# Patient Record
Sex: Female | Born: 2005 | Race: White | Hispanic: No | Smoking: Former smoker
Health system: Southern US, Community
[De-identification: ages and names within clinical notes are randomized; demographics above are authoritative.]

## PROBLEM LIST (undated history)

## (undated) DIAGNOSIS — F419 Anxiety disorder, unspecified: Secondary | ICD-10-CM

## (undated) DIAGNOSIS — N83209 Unspecified ovarian cyst, unspecified side: Secondary | ICD-10-CM

## (undated) DIAGNOSIS — Z789 Other specified health status: Secondary | ICD-10-CM

## (undated) DIAGNOSIS — D649 Anemia, unspecified: Secondary | ICD-10-CM

## (undated) HISTORY — DX: Anxiety disorder, unspecified: F41.9

## (undated) HISTORY — DX: Other specified health status: Z78.9

## (undated) HISTORY — DX: Unspecified ovarian cyst, unspecified side: N83.209

## (undated) HISTORY — DX: Anemia, unspecified: D64.9

## (undated) HISTORY — PX: TONSILLECTOMY: SUR1361

---
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
## 2023-01-16 ENCOUNTER — Ambulatory Visit: Payer: Medicaid Other | Admitting: Licensed Practical Nurse

## 2023-02-10 ENCOUNTER — Ambulatory Visit: Payer: Medicaid Other

## 2023-02-13 ENCOUNTER — Ambulatory Visit: Payer: Medicaid Other | Admitting: Certified Nurse Midwife

## 2023-03-26 NOTE — Progress Notes (Signed)
PCP:  [facility]   Chief Complaint  Patient presents with   Gynecologic Exam    Discuss changing BC method to IUD due to severe pain during cycles     HPI:      [name] is a 17 y.o. G0P0000 whose LMP was Patient's last menstrual period was 03/16/2023 (exact date)., presents today for her annual examination.  Her menses are regular every 28-30 days, lasting 3-5 days, mod flow with 1 heavy day. Was doing continuous dosing but still getting periods, so now does placebo pills monthly. Dysmen mod to severe, worse past 3-4 months. Ibup 800 mg and tylenol don't help; sometimes takes midol with improvement. Doesn't miss work/activities due to pain. Dysmen was more "mild" last time I saw her. Hx of LTO cyst 4/23.  Had been on depo and then seasonale with irregular bleeding so doesn't want to try those. Would like to try IUD for dysmen.   Hx of 4.1 cm LTO hemorrhagic cyst 4/23; no f/u imaging needed  Sex activity: single partner, contraception - OCP (estrogen/progesterone). No pain/bleeding. Last Pap: N/A due to age Hx of STDs: none  There is no FH of breast cancer. There is no FH of ovarian cancer. The patient does not do self-breast exams.  Tobacco use: quit vaping 3-4 wks ago Alcohol use: none No drug use.  Exercise: moderately active  She does get adequate calcium but not Vitamin D in her diet.  Past Medical History:  Diagnosis Date   Anemia    Ovarian cyst     Past Surgical History:  Procedure Laterality Date   TONSILLECTOMY      Family History  Problem Relation Age of Onset   Hypertension Mother    Asthma Mother    Diabetes Maternal Grandmother    Diabetes Paternal Grandmother     Social History   Socioeconomic History   Marital status: Single    Spouse name: Not on file   Number of children: Not on file   Years of education: Not on file   Highest education level: Not on file  Occupational History   Not on file  Tobacco Use    Smoking status: Never   Smokeless tobacco: Never  Vaping Use   Vaping Use: Former  Substance and Sexual Activity   Alcohol use: No   Drug use: Yes    Types: Marijuana   Sexual activity: Yes    Birth control/protection: Pill  Other Topics Concern   Not on file  Social History Narrative   Not on file   Social Determinants of Health   Financial Resource Strain: Not on file  Food Insecurity: Not on file  Transportation Needs: Not on file  Physical Activity: Not on file  Stress: Not on file  Social Connections: Not on file  Intimate Partner Violence: Not At Risk (07/12/2020)   Humiliation, Afraid, Rape, and Kick questionnaire    Fear of Current or Ex-Partner: No    Emotionally Abused: No    Physically Abused: No    Sexually Abused: No     Current Outpatient Medications:    drospirenone-ethinyl estradiol (NIKKI) 3-0.02 MG tablet, Take 1 tablet by mouth daily. CONTINUOUS DOSING, Disp: 84 tablet, Rfl: 3   misoprostol (CYTOTEC) 100 MCG tablet, Take 1 tablet (100 mcg total) by mouth once for 1 dose. 1 hour before appt, Disp: 1 tablet, Rfl: 0     ROS:  Review of Systems  Constitutional:  Negative for fatigue,  fever and unexpected weight change.  Respiratory:  Negative for cough, shortness of breath and wheezing.   Cardiovascular:  Negative for chest pain, palpitations and leg swelling.  Gastrointestinal:  Negative for blood in stool, constipation, diarrhea, nausea and vomiting.  Endocrine: Negative for cold intolerance, heat intolerance and polyuria.  Genitourinary:  Negative for dyspareunia, dysuria, flank pain, frequency, genital sores, hematuria, menstrual problem, pelvic pain, urgency, vaginal bleeding, vaginal discharge and vaginal pain.  Musculoskeletal:  Negative for back pain, joint swelling and myalgias.  Skin:  Negative for rash.  Neurological:  Negative for dizziness, syncope, light-headedness, numbness and headaches.  Hematological:  Negative for adenopathy.   Psychiatric/Behavioral:  Negative for agitation, confusion, sleep disturbance and suicidal ideas. The patient is not nervous/anxious.    BREAST: No symptoms   Objective: BP 114/70   Ht 5\' 2"  (1.575 m)   Wt 143 lb (64.9 kg)   LMP 03/16/2023 (Exact Date)   BMI 26.16 kg/m    Physical Exam Constitutional:      Appearance: She is well-developed.  Genitourinary:     Vulva normal.     Right Labia: No rash, tenderness or lesions.    Left Labia: No tenderness, lesions or rash.    No vaginal discharge, erythema or tenderness.      Right Adnexa: not tender and no mass present.    Left Adnexa: not tender and no mass present.    No cervical friability or polyp.     Uterus is not enlarged or tender.  Breasts:    Right: No mass, nipple discharge, skin change or tenderness.     Left: No mass, nipple discharge, skin change or tenderness.  Neck:     Thyroid: No thyromegaly.  Cardiovascular:     Rate and Rhythm: Normal rate and regular rhythm.     Heart sounds: Normal heart sounds. No murmur heard. Pulmonary:     Effort: Pulmonary effort is normal.     Breath sounds: Normal breath sounds.  Abdominal:     Palpations: Abdomen is soft.     Tenderness: There is no abdominal tenderness. There is no guarding or rebound.  Musculoskeletal:        General: Normal range of motion.     Cervical back: Normal range of motion.  Lymphadenopathy:     Cervical: No cervical adenopathy.  Neurological:     General: No focal deficit present.     Mental Status: She is alert and oriented to person, place, and time.     Cranial Nerves: No cranial nerve deficit.  Skin:    General: Skin is warm and dry.  Psychiatric:        Mood and Affect: Mood normal.        Behavior: Behavior normal.        Thought Content: Thought content normal.        Judgment: Judgment normal.  Vitals reviewed.     Assessment/Plan:  Encounter for annual routine gynecological examination  Screening for STD (sexually  transmitted disease) - Plan: Chlamydia/Gonococcus/Trichomonas, NAA  Encounter for initial prescription of intrauterine contraceptive device (IUD) - Plan: misoprostol (CYTOTEC) 100 MCG tablet; Kyleena pros/cons discussed. Pt will RTO with menses for insertion; Rx cytotec/NSAIDs.   Dysmenorrhea--not relieved with OCPs now, rule out STDs. Pt wants to try IUD. Will check another GYN u/s if sx persist with IUD.    Meds ordered this encounter  Medications   misoprostol (CYTOTEC) 100 MCG tablet    Sig: Take 1 tablet (100 mcg  total) by mouth once for 1 dose. 1 hour before appt    Dispense:  1 tablet    Refill:  0    Order Specific Question:   Supervising Provider    Answer:   Waymon Budge             GYN counsel adequate intake of calcium and vitamin D, diet and exercise; d/c vaping     F/U  Return in about 1 year (around 03/26/2024).  Dabney Dever B. Beryle Zeitz, PA-C 03/27/2023 10:18 AM

## 2023-03-27 ENCOUNTER — Ambulatory Visit: Payer: MEDICAID | Admitting: Obstetrics and Gynecology

## 2023-03-27 ENCOUNTER — Encounter: Payer: Self-pay | Admitting: Obstetrics and Gynecology

## 2023-03-27 VITALS — BP 114/70 | Ht 62.0 in | Wt 143.0 lb

## 2023-03-27 DIAGNOSIS — Z3041 Encounter for surveillance of contraceptive pills: Secondary | ICD-10-CM

## 2023-03-27 DIAGNOSIS — Z01419 Encounter for gynecological examination (general) (routine) without abnormal findings: Secondary | ICD-10-CM

## 2023-03-27 DIAGNOSIS — N946 Dysmenorrhea, unspecified: Secondary | ICD-10-CM | POA: Insufficient documentation

## 2023-03-27 DIAGNOSIS — Z30014 Encounter for initial prescription of intrauterine contraceptive device: Secondary | ICD-10-CM

## 2023-03-27 DIAGNOSIS — N83202 Unspecified ovarian cyst, left side: Secondary | ICD-10-CM

## 2023-03-27 DIAGNOSIS — Z113 Encounter for screening for infections with a predominantly sexual mode of transmission: Secondary | ICD-10-CM

## 2023-03-27 MED ORDER — MISOPROSTOL 100 MCG PO TABS
100.0000 ug | ORAL_TABLET | Freq: Once | ORAL | 0 refills | Status: DC
Start: 2023-03-27 — End: 2023-04-14

## 2023-03-27 NOTE — Patient Instructions (Signed)
I value your feedback and you entrusting us with your care. If you get a [facility] patient survey, I would appreciate you taking the time to let us know about your experience today. Thank you! ? ? ?

## 2023-03-30 LAB — CHLAMYDIA/GONOCOCCUS/TRICHOMONAS, NAA
Chlamydia by NAA: NEGATIVE
Gonococcus by NAA: NEGATIVE
Trich vag by NAA: NEGATIVE

## 2023-04-12 ENCOUNTER — Other Ambulatory Visit: Payer: Self-pay

## 2023-04-12 ENCOUNTER — Emergency Department
Admission: EM | Admit: 2023-04-12 | Discharge: 2023-04-12 | Disposition: A | Discharge status: Home / Self Care | Payer: MEDICAID | Attending: Emergency Medicine | Admitting: Emergency Medicine

## 2023-04-12 ENCOUNTER — Emergency Department: Payer: MEDICAID

## 2023-04-12 ENCOUNTER — Encounter: Payer: Self-pay | Admitting: Emergency Medicine

## 2023-04-12 DIAGNOSIS — Y92410 Unspecified street and highway as the place of occurrence of the external cause: Secondary | ICD-10-CM | POA: Diagnosis not present

## 2023-04-12 DIAGNOSIS — R42 Dizziness and giddiness: Secondary | ICD-10-CM | POA: Insufficient documentation

## 2023-04-12 DIAGNOSIS — S0990XA Unspecified injury of head, initial encounter: Secondary | ICD-10-CM | POA: Diagnosis present

## 2023-04-12 DIAGNOSIS — S060X0A Concussion without loss of consciousness, initial encounter: Secondary | ICD-10-CM | POA: Insufficient documentation

## 2023-04-12 LAB — CBC
HCT: 39.4 % (ref 36.0–46.0)
Hemoglobin: 13.3 g/dL (ref 12.0–15.0)
MCH: 29.7 pg (ref 26.0–34.0)
MCHC: 33.8 g/dL (ref 30.0–36.0)
MCV: 87.9 fL (ref 80.0–100.0)
Platelets: 155 10*3/uL (ref 150–400)
RBC: 4.48 MIL/uL (ref 3.87–5.11)
RDW: 13.5 % (ref 11.5–15.5)
WBC: 8.3 10*3/uL (ref 4.0–10.5)
nRBC: 0 % (ref 0.0–0.2)

## 2023-04-12 LAB — URINALYSIS, ROUTINE W REFLEX MICROSCOPIC
Bacteria, UA: NONE SEEN
Bilirubin Urine: NEGATIVE
Glucose, UA: NEGATIVE mg/dL
Ketones, ur: 20 mg/dL — AB
Leukocytes,Ua: NEGATIVE
Nitrite: NEGATIVE
Protein, ur: NEGATIVE mg/dL
Specific Gravity, Urine: 1.02 (ref 1.005–1.030)
pH: 5 (ref 5.0–8.0)

## 2023-04-12 LAB — BASIC METABOLIC PANEL
Anion gap: 9 (ref 5–15)
BUN: 9 mg/dL (ref 6–20)
CO2: 23 mmol/L (ref 22–32)
Calcium: 9.2 mg/dL (ref 8.9–10.3)
Chloride: 108 mmol/L (ref 98–111)
Creatinine, Ser: 0.76 mg/dL (ref 0.44–1.00)
GFR, Estimated: >60 mL/min (ref >60)
Glucose, Bld: 90 mg/dL (ref 70–99)
Potassium: 3.4 mmol/L — ABNORMAL LOW (ref 3.5–5.1)
Sodium: 140 mmol/L (ref 135–145)

## 2023-04-12 LAB — POC URINE PREG, ED: Preg Test, Ur: NEGATIVE

## 2023-04-12 LAB — CBG MONITORING, ED: Glucose-Capillary: 91 mg/dL (ref 70–99)
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
## 2023-04-12 NOTE — Discharge Instructions (Addendum)
Please follow up with primary care within the week.  Return to the ER if symptoms worsen.
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
## 2023-04-14 ENCOUNTER — Encounter: Payer: Self-pay | Admitting: Obstetrics and Gynecology

## 2023-04-14 ENCOUNTER — Ambulatory Visit: Payer: MEDICAID | Admitting: Obstetrics and Gynecology

## 2023-04-14 VITALS — BP 110/60 | Ht 62.0 in | Wt 139.0 lb

## 2023-04-14 DIAGNOSIS — Z30430 Encounter for insertion of intrauterine contraceptive device: Secondary | ICD-10-CM

## 2023-04-14 MED ORDER — LEVONORGESTREL 19.5 MG IU IUD
INTRAUTERINE_SYSTEM | Freq: Once | INTRAUTERINE | Status: AC
Start: 2023-04-14 — End: 2023-04-14

## 2023-04-14 NOTE — Progress Notes (Signed)
   Chief Complaint  Patient presents with   Contraception    Kyleena insertion     IUD PROCEDURE NOTE:  Courtney [name] is a 17 y.o. G0P0000 here for  [facility]   IUD insertion for BC/dysmen. Had been on depo and then seasonale with irregular bleeding so doesn't want to try those. Would like to try IUD for dysmen.    BP 110/60   Ht 5\' 2"  (1.575 m)   Wt 139 lb (63 kg)   LMP 04/12/2023 (Exact Date)   BMI 25.42 kg/m   IUD Insertion Procedure Note Patient identified, informed consent performed, consent signed.   Discussed risks of irregular bleeding, cramping, infection, malpositioning or misplacement of the IUD outside the uterus which may require further procedure such as laparoscopy, risk of failure <1%. Time out was performed.    Speculum placed in the vagina.  Cervix visualized.  Cleaned with Betadine x 2.  Grasped anteriorly with a single tooth tenaculum.  Uterus sounded to 6.5 cm.   IUD placed per manufacturer's recommendations.  Strings trimmed to 3 cm. Tenaculum was removed, good hemostasis noted.  Patient tolerated procedure well.   ASSESSMENT:  Encounter for insertion of intrauterine contraceptive device (IUD) - Plan: levonorgestrel (KYLEENA) 19.5 MG IUD   Meds ordered this encounter  Medications   levonorgestrel (KYLEENA) 19.5 MG IUD     Plan:  Patient was given post-procedure instructions.  She was advised to have backup contraception for one week.   Call if you are having increasing pain, cramps or bleeding or if you have a fever greater than 100.4 degrees F., shaking chills, nausea or vomiting. Patient was also asked to check IUD strings periodically and follow up in 4 weeks for IUD check.  Return in about 6 weeks (around 05/26/2023) for IUD f/u.  Pradyun Ishman B. Kennidi Yoshida, PA-C 04/14/2023 4:29 PM

## 2023-04-14 NOTE — Patient Instructions (Addendum)
I value your feedback and you entrusting us with your care. If you get a [facility] patient survey, I would appreciate you taking the time to let us know about your experience today. Thank you!  Instructions after IUD insertion  Most women experience no significant problems after insertion of an IUD, however minor cramping and spotting for a few days is common. Cramps may be treated with ibuprofen 800mg every 8 hours or Tylenol 650 mg every 4 hours. Contact [facility] immediately if you experience any of the following symptoms during the next week: temperature >99.6 degrees, worsening pelvic pain, abdominal pain, fainting, unusually heavy vaginal bleeding, foul vaginal discharge, or if you think you have expelled the IUD. Nothing inserted in the vagina for 48 hours. You will be scheduled for a follow up visit in approximately four weeks.    

## 2023-05-05 ENCOUNTER — Encounter: Payer: Self-pay | Admitting: Obstetrics and Gynecology

## 2023-06-01 NOTE — Progress Notes (Deleted)
   No chief complaint on file.    History of Present Illness:  Courtney [name] is a 17 y.o. that had a  [place]  IUD placed approximately 6 weeks ago. Since that time, she denies dyspareunia, pelvic pain, non-menstrual bleeding, vaginal d/c, heavy bleeding.   Review of Systems  Physical Exam:  There were no vitals taken for this visit. There is no height or weight on file to calculate BMI.  Pelvic exam:  Two IUD strings {DESC; PRESENT/ABSENT:17923} seen coming from the cervical os. EGBUS, vaginal vault and cervix: within normal limits   Assessment:   No diagnosis found.  IUD strings present in proper location; pt doing well  Plan: F/u if any signs of infection or can no longer feel the strings.   Mylei Brackeen B. Sadao Weyer, PA-C 06/01/2023 1:00 PM

## 2023-06-02 ENCOUNTER — Telehealth: Payer: Self-pay | Admitting: Obstetrics and Gynecology

## 2023-06-02 ENCOUNTER — Ambulatory Visit: Payer: MEDICAID | Admitting: Obstetrics and Gynecology

## 2023-06-02 DIAGNOSIS — Z30431 Encounter for routine checking of intrauterine contraceptive device: Secondary | ICD-10-CM

## 2023-06-02 NOTE — Telephone Encounter (Signed)
I contacted the patient via phone. The patient was scheduled for 9/10 with Alicia Copland at 9:15 am. The patient missed the appointment. I left message for the patient to call back to reschduled.

## 2023-07-12 NOTE — Progress Notes (Unsigned)
   No chief complaint on file.    History of Present Illness:  Courtney [name] is a 17 y.o. that had a  Kyleena  IUD placed 04/14/23. Since that time, she denies dyspareunia, pelvic pain, non-menstrual bleeding, vaginal d/c, heavy bleeding.   Review of Systems  Physical Exam:  There were no vitals taken for this visit. There is no height or weight on file to calculate BMI.  Pelvic exam:  Two IUD strings {DESC; PRESENT/ABSENT:17923} seen coming from the cervical os. EGBUS, vaginal vault and cervix: within normal limits   Assessment:   No diagnosis found.  IUD strings present in proper location; pt doing well  Plan: F/u if any signs of infection or can no longer feel the strings.   Courtney [name] B. Riya Huxford, PA-C 07/12/2023 5:05 PM

## 2023-07-13 ENCOUNTER — Ambulatory Visit: Payer: MEDICAID | Admitting: Obstetrics and Gynecology

## 2023-07-13 ENCOUNTER — Encounter: Payer: Self-pay | Admitting: Obstetrics and Gynecology

## 2023-07-13 VITALS — BP 102/60 | Ht 62.0 in | Wt 140.0 lb

## 2023-07-13 DIAGNOSIS — N921 Excessive and frequent menstruation with irregular cycle: Secondary | ICD-10-CM

## 2023-07-13 DIAGNOSIS — Z30431 Encounter for routine checking of intrauterine contraceptive device: Secondary | ICD-10-CM | POA: Diagnosis not present

## 2023-07-13 DIAGNOSIS — N946 Dysmenorrhea, unspecified: Secondary | ICD-10-CM

## 2023-07-13 NOTE — Patient Instructions (Signed)
I value your feedback and you entrusting us with your care. If you get a [facility] patient survey, I would appreciate you taking the time to let us know about your experience today. Thank you! ? ? ?

## 2023-08-12 ENCOUNTER — Ambulatory Visit: Payer: MEDICAID

## 2023-08-12 DIAGNOSIS — Z30431 Encounter for routine checking of intrauterine contraceptive device: Secondary | ICD-10-CM

## 2023-08-12 DIAGNOSIS — N946 Dysmenorrhea, unspecified: Secondary | ICD-10-CM

## 2023-08-12 DIAGNOSIS — N921 Excessive and frequent menstruation with irregular cycle: Secondary | ICD-10-CM

## 2023-08-13 ENCOUNTER — Telehealth: Payer: Self-pay | Admitting: Obstetrics and Gynecology

## 2023-08-13 DIAGNOSIS — N921 Excessive and frequent menstruation with irregular cycle: Secondary | ICD-10-CM

## 2023-08-13 MED ORDER — NORETHINDRONE 0.35 MG PO TABS
1.0000 | ORAL_TABLET | Freq: Every day | ORAL | 0 refills | Status: DC
Start: 2023-08-13 — End: 2023-12-29

## 2023-08-13 NOTE — Telephone Encounter (Signed)
LM with neg GYN u/s results. IUD in correct location. Will try Rx POPs for BTB/dysmen. Pt to f/u prn.   Meds ordered this encounter  Medications   norethindrone (MICRONOR) 0.35 MG tablet    Sig: Take 1 tablet (0.35 mg total) by mouth daily.    Dispense:  84 tablet    Refill:  0    Order Specific Question:   Supervising Provider    Answer:   Hildred Laser [[id number]]

## 2023-09-15 ENCOUNTER — Ambulatory Visit
Admission: EM | Admit: 2023-09-15 | Discharge: 2023-09-15 | Disposition: A | Discharge status: Home / Self Care | Payer: MEDICAID | Attending: Emergency Medicine | Admitting: Emergency Medicine

## 2023-09-15 ENCOUNTER — Emergency Department: Payer: MEDICAID | Admitting: Certified Registered"

## 2023-09-15 ENCOUNTER — Other Ambulatory Visit: Payer: Self-pay

## 2023-09-15 ENCOUNTER — Encounter
Admission: EM | Disposition: A | Discharge status: Home / Self Care | Payer: Self-pay | Source: Home / Self Care | Attending: Emergency Medicine

## 2023-09-15 ENCOUNTER — Emergency Department: Payer: MEDICAID

## 2023-09-15 DIAGNOSIS — K352 Acute appendicitis with generalized peritonitis, without perforation or abscess: Secondary | ICD-10-CM

## 2023-09-15 DIAGNOSIS — K3580 Unspecified acute appendicitis: Secondary | ICD-10-CM | POA: Insufficient documentation

## 2023-09-15 DIAGNOSIS — Z87891 Personal history of nicotine dependence: Secondary | ICD-10-CM | POA: Insufficient documentation

## 2023-09-15 HISTORY — PX: LAPAROSCOPIC APPENDECTOMY: SHX408

## 2023-09-15 LAB — URINALYSIS, ROUTINE W REFLEX MICROSCOPIC
Bilirubin Urine: NEGATIVE
Glucose, UA: NEGATIVE mg/dL
Ketones, ur: 20 mg/dL — AB
Nitrite: NEGATIVE
Protein, ur: NEGATIVE mg/dL
Specific Gravity, Urine: 1.013 (ref 1.005–1.030)
pH: 6 (ref 5.0–8.0)

## 2023-09-15 LAB — CBC
HCT: 41.6 % (ref 36.0–46.0)
Hemoglobin: 14 g/dL (ref 12.0–15.0)
MCH: 30.8 pg (ref 26.0–34.0)
MCHC: 33.7 g/dL (ref 30.0–36.0)
MCV: 91.6 fL (ref 80.0–100.0)
Platelets: 123 10*3/uL — ABNORMAL LOW (ref 150–400)
RBC: 4.54 MIL/uL (ref 3.87–5.11)
RDW: 13.4 % (ref 11.5–15.5)
WBC: 12.5 10*3/uL — ABNORMAL HIGH (ref 4.0–10.5)
nRBC: 0 % (ref 0.0–0.2)

## 2023-09-15 LAB — COMPREHENSIVE METABOLIC PANEL
ALT: 17 U/L (ref 0–44)
AST: 18 U/L (ref 15–41)
Albumin: 4.5 g/dL (ref 3.5–5.0)
Alkaline Phosphatase: 57 U/L (ref 38–126)
Anion gap: 9 (ref 5–15)
BUN: 8 mg/dL (ref 6–20)
CO2: 23 mmol/L (ref 22–32)
Calcium: 9.1 mg/dL (ref 8.9–10.3)
Chloride: 101 mmol/L (ref 98–111)
Creatinine, Ser: 0.74 mg/dL (ref 0.44–1.00)
GFR, Estimated: >60 mL/min (ref >60)
Glucose, Bld: 109 mg/dL — ABNORMAL HIGH (ref 70–99)
Potassium: 4.2 mmol/L (ref 3.5–5.1)
Sodium: 133 mmol/L — ABNORMAL LOW (ref 135–145)
Total Bilirubin: 0.8 mg/dL (ref <1.2)
Total Protein: 7.6 g/dL (ref 6.5–8.1)

## 2023-09-15 LAB — POC URINE PREG, ED: Preg Test, Ur: NEGATIVE

## 2023-09-15 LAB — LIPASE, BLOOD: Lipase: 25 U/L (ref 11–51)

## 2023-09-15 SURGERY — APPENDECTOMY, LAPAROSCOPIC
Anesthesia: General

## 2023-09-15 MED ORDER — OXYCODONE HCL 5 MG PO TABS: 5.0000 mg | ORAL_TABLET | Freq: Four times a day (QID) | ORAL | 0 refills | Status: DC | PRN

## 2023-09-15 MED ORDER — ACETAMINOPHEN 10 MG/ML IV SOLN
INTRAVENOUS | Status: AC
  Filled 2023-09-15: qty 100

## 2023-09-15 MED ORDER — SODIUM CHLORIDE 0.9 % IV BOLUS
1000.0000 mL | Freq: Once | INTRAVENOUS | Status: AC
  Administered 2023-09-15: 1000 mL via INTRAVENOUS

## 2023-09-15 MED ORDER — LIDOCAINE HCL (CARDIAC) PF 100 MG/5ML IV SOSY
PREFILLED_SYRINGE | INTRAVENOUS | Status: DC | PRN
  Administered 2023-09-15: 100 mg via INTRAVENOUS

## 2023-09-15 MED ORDER — KETAMINE HCL 50 MG/5ML IJ SOSY
PREFILLED_SYRINGE | INTRAMUSCULAR | Status: AC
  Filled 2023-09-15: qty 5

## 2023-09-15 MED ORDER — ACETAMINOPHEN 10 MG/ML IV SOLN
INTRAVENOUS | Status: DC | PRN
  Administered 2023-09-15: 1000 mg via INTRAVENOUS

## 2023-09-15 MED ORDER — ACETAMINOPHEN 10 MG/ML IV SOLN: 1000.0000 mg | Freq: Once | INTRAVENOUS | Status: DC | PRN

## 2023-09-15 MED ORDER — PROPOFOL 10 MG/ML IV BOLUS
INTRAVENOUS | Status: DC | PRN
  Administered 2023-09-15: 150 mg via INTRAVENOUS

## 2023-09-15 MED ORDER — HYDROMORPHONE HCL 1 MG/ML IJ SOLN
INTRAMUSCULAR | Status: DC | PRN
  Administered 2023-09-15: .5 mg via INTRAVENOUS

## 2023-09-15 MED ORDER — HYDROMORPHONE HCL 1 MG/ML IJ SOLN
INTRAMUSCULAR | Status: AC
  Filled 2023-09-15: qty 1

## 2023-09-15 MED ORDER — SODIUM CHLORIDE 0.9 % IV SOLN
INTRAVENOUS | Status: AC
  Filled 2023-09-15: qty 2

## 2023-09-15 MED ORDER — MIDAZOLAM HCL 2 MG/2ML IJ SOLN
INTRAMUSCULAR | Status: AC
  Filled 2023-09-15: qty 2

## 2023-09-15 MED ORDER — 0.9 % SODIUM CHLORIDE (POUR BTL) OPTIME
TOPICAL | Status: DC | PRN
  Administered 2023-09-15: 500 mL

## 2023-09-15 MED ORDER — OXYCODONE HCL 5 MG/5ML PO SOLN: 5.0000 mg | Freq: Once | ORAL | Status: DC | PRN

## 2023-09-15 MED ORDER — DROPERIDOL 2.5 MG/ML IJ SOLN
INTRAMUSCULAR | Status: AC
  Filled 2023-09-15: qty 2

## 2023-09-15 MED ORDER — KETOROLAC TROMETHAMINE 30 MG/ML IJ SOLN
15.0000 mg | Freq: Once | INTRAMUSCULAR | Status: AC
  Administered 2023-09-15: 15 mg via INTRAVENOUS
  Filled 2023-09-15: qty 1

## 2023-09-15 MED ORDER — PROPOFOL 10 MG/ML IV BOLUS
INTRAVENOUS | Status: AC
  Filled 2023-09-15: qty 20

## 2023-09-15 MED ORDER — DEXMEDETOMIDINE HCL IN NACL 80 MCG/20ML IV SOLN
INTRAVENOUS | Status: DC | PRN
  Administered 2023-09-15: 12 ug via INTRAVENOUS
  Administered 2023-09-15: 8 ug via INTRAVENOUS

## 2023-09-15 MED ORDER — DEXAMETHASONE SODIUM PHOSPHATE 10 MG/ML IJ SOLN
INTRAMUSCULAR | Status: AC
  Filled 2023-09-15: qty 1

## 2023-09-15 MED ORDER — SUCCINYLCHOLINE CHLORIDE 200 MG/10ML IV SOSY
PREFILLED_SYRINGE | INTRAVENOUS | Status: DC | PRN
  Administered 2023-09-15: 80 mg via INTRAVENOUS

## 2023-09-15 MED ORDER — ONDANSETRON HCL 4 MG/2ML IJ SOLN
INTRAMUSCULAR | Status: DC | PRN
  Administered 2023-09-15: 4 mg via INTRAVENOUS

## 2023-09-15 MED ORDER — DEXAMETHASONE SODIUM PHOSPHATE 10 MG/ML IJ SOLN
INTRAMUSCULAR | Status: DC | PRN
  Administered 2023-09-15: 10 mg via INTRAVENOUS

## 2023-09-15 MED ORDER — ONDANSETRON HCL 4 MG/2ML IJ SOLN
INTRAMUSCULAR | Status: AC
  Filled 2023-09-15: qty 2

## 2023-09-15 MED ORDER — SUGAMMADEX SODIUM 200 MG/2ML IV SOLN
INTRAVENOUS | Status: DC | PRN
  Administered 2023-09-15: 200 mg via INTRAVENOUS

## 2023-09-15 MED ORDER — ROCURONIUM BROMIDE 100 MG/10ML IV SOLN
INTRAVENOUS | Status: DC | PRN
  Administered 2023-09-15: 50 mg via INTRAVENOUS

## 2023-09-15 MED ORDER — BUPIVACAINE LIPOSOME 1.3 % IJ SUSP
INTRAMUSCULAR | Status: DC | PRN
  Administered 2023-09-15: 10 mL

## 2023-09-15 MED ORDER — DROPERIDOL 2.5 MG/ML IJ SOLN
0.6250 mg | Freq: Once | INTRAMUSCULAR | Status: AC | PRN
  Administered 2023-09-15: 0.625 mg via INTRAVENOUS

## 2023-09-15 MED ORDER — LACTATED RINGERS IV SOLN: INTRAVENOUS | Status: DC | PRN

## 2023-09-15 MED ORDER — FENTANYL CITRATE (PF) 100 MCG/2ML IJ SOLN: 25.0000 ug | INTRAMUSCULAR | Status: DC | PRN

## 2023-09-15 MED ORDER — OXYCODONE HCL 5 MG PO TABS: 5.0000 mg | ORAL_TABLET | Freq: Once | ORAL | Status: DC | PRN

## 2023-09-15 MED ORDER — KETAMINE HCL 50 MG/5ML IJ SOSY
PREFILLED_SYRINGE | INTRAMUSCULAR | Status: DC | PRN
  Administered 2023-09-15: 30 mg via INTRAVENOUS

## 2023-09-15 MED ORDER — SODIUM CHLORIDE 0.9 % IV SOLN
1.0000 g | Freq: Two times a day (BID) | INTRAVENOUS | Status: DC
  Filled 2023-09-15 (×2): qty 1

## 2023-09-15 MED ORDER — PHENYLEPHRINE 80 MCG/ML (10ML) SYRINGE FOR IV PUSH (FOR BLOOD PRESSURE SUPPORT)
PREFILLED_SYRINGE | INTRAVENOUS | Status: DC | PRN
  Administered 2023-09-15: 120 ug via INTRAVENOUS
  Administered 2023-09-15: 80 ug via INTRAVENOUS

## 2023-09-15 MED ORDER — METOCLOPRAMIDE HCL 5 MG/ML IJ SOLN
10.0000 mg | Freq: Once | INTRAMUSCULAR | Status: AC
  Administered 2023-09-15: 10 mg via INTRAVENOUS
  Filled 2023-09-15: qty 2

## 2023-09-15 MED ORDER — ROCURONIUM BROMIDE 10 MG/ML (PF) SYRINGE
PREFILLED_SYRINGE | INTRAVENOUS | Status: AC
  Filled 2023-09-15: qty 10

## 2023-09-15 MED ORDER — BUPIVACAINE-EPINEPHRINE 0.5% -1:200000 IJ SOLN
INTRAMUSCULAR | Status: DC | PRN
  Administered 2023-09-15: 10 mL

## 2023-09-15 MED ORDER — SODIUM CHLORIDE 0.9 % IV SOLN
2.0000 g | Freq: Two times a day (BID) | INTRAVENOUS | Status: DC
  Administered 2023-09-15: 2 g via INTRAVENOUS
  Filled 2023-09-15: qty 2

## 2023-09-15 MED ORDER — IOHEXOL 300 MG/ML  SOLN
80.0000 mL | Freq: Once | INTRAMUSCULAR | Status: AC | PRN
  Administered 2023-09-15: 80 mL via INTRAVENOUS

## 2023-09-15 MED ORDER — PHENYLEPHRINE 80 MCG/ML (10ML) SYRINGE FOR IV PUSH (FOR BLOOD PRESSURE SUPPORT)
PREFILLED_SYRINGE | INTRAVENOUS | Status: AC
Start: 2023-09-15 — End: ?
  Filled 2023-09-15: qty 10

## 2023-09-15 MED ORDER — ONDANSETRON HCL 4 MG/2ML IJ SOLN
4.0000 mg | Freq: Once | INTRAMUSCULAR | Status: AC
  Administered 2023-09-15: 4 mg via INTRAVENOUS
  Filled 2023-09-15: qty 2

## 2023-09-15 MED ORDER — BUPIVACAINE-EPINEPHRINE (PF) 0.5% -1:200000 IJ SOLN
INTRAMUSCULAR | Status: AC
  Filled 2023-09-15: qty 10

## 2023-09-15 MED ORDER — MIDAZOLAM HCL 2 MG/2ML IJ SOLN
INTRAMUSCULAR | Status: DC | PRN
  Administered 2023-09-15: 2 mg via INTRAVENOUS

## 2023-09-15 MED ORDER — SUCCINYLCHOLINE CHLORIDE 200 MG/10ML IV SOSY
PREFILLED_SYRINGE | INTRAVENOUS | Status: AC
Start: 2023-09-15 — End: ?
  Filled 2023-09-15: qty 10

## 2023-09-15 SURGICAL SUPPLY — 49 items
APPLIER CLIP ROT 10 11.4 M/L (STAPLE)
BLADE CLIPPER SURG (BLADE) IMPLANT
CLIP APPLIE ROT 10 11.4 M/L (STAPLE) IMPLANT
CORD MONOPOLAR M/FML 12FT (MISCELLANEOUS) ×1 IMPLANT
CUTTER ECHEON FLEX ENDO 45 340 (ENDOMECHANICALS) IMPLANT
CUTTER FLEX LINEAR 45M (STAPLE) IMPLANT
DERMABOND ADVANCED .7 DNX12 (GAUZE/BANDAGES/DRESSINGS) ×1 IMPLANT
DRAIN CHANNEL JP 19F RND 3/16 (MISCELLANEOUS) IMPLANT
ELECT REM PT RETURN 9FT ADLT (ELECTROSURGICAL) ×1
ELECTRODE REM PT RTRN 9FT ADLT (ELECTROSURGICAL) ×1 IMPLANT
EVACUATOR SILICONE 100CC (DRAIN) IMPLANT
GLOVE BIOGEL PI IND STRL 7.5 (GLOVE) ×1 IMPLANT
GLOVE SURG SYN 7.0 (GLOVE) ×1 IMPLANT
GLOVE SURG SYN 7.0 PF PI (GLOVE) ×1 IMPLANT
GOWN STRL REUS W/ TWL LRG LVL3 (GOWN DISPOSABLE) ×2 IMPLANT
GRASPER SUT TROCAR 14GX15 (MISCELLANEOUS) ×1 IMPLANT
IRRIGATION STRYKERFLOW (MISCELLANEOUS) IMPLANT
IRRIGATOR STRYKERFLOW (MISCELLANEOUS)
IV NS 1000ML BAXH (IV SOLUTION) IMPLANT
KIT PINK PAD W/HEAD ARE REST (MISCELLANEOUS) ×1 IMPLANT
KIT PINK PAD W/HEAD ARM REST (MISCELLANEOUS) ×1 IMPLANT
KIT TURNOVER KIT A (KITS) ×1 IMPLANT
LIGASURE LAP MARYLAND 5MM 37CM (ELECTROSURGICAL) IMPLANT
MANIFOLD NEPTUNE II (INSTRUMENTS) ×1 IMPLANT
NDL HYPO 22X1.5 SAFETY MO (MISCELLANEOUS) ×1 IMPLANT
NDL INSUFFLATION 14GA 120MM (NEEDLE) ×1 IMPLANT
NEEDLE HYPO 22X1.5 SAFETY MO (MISCELLANEOUS) ×1 IMPLANT
NEEDLE INSUFFLATION 14GA 120MM (NEEDLE) ×1 IMPLANT
NS IRRIG 500ML POUR BTL (IV SOLUTION) ×1 IMPLANT
PACK LAP CHOLECYSTECTOMY (MISCELLANEOUS) ×1 IMPLANT
RELOAD 45 VASCULAR/THIN (ENDOMECHANICALS) IMPLANT
RELOAD STAPLE 45 2.5 WHT GRN (ENDOMECHANICALS) IMPLANT
RELOAD STAPLE 45 3.5 BLU ETS (ENDOMECHANICALS) IMPLANT
RELOAD STAPLE 45 3.6 BLU REG (STAPLE) IMPLANT
RELOAD STAPLE TA45 3.5 REG BLU (ENDOMECHANICALS) ×1 IMPLANT
SCISSORS METZENBAUM CVD 33 (INSTRUMENTS) IMPLANT
SET TUBE SMOKE EVAC HIGH FLOW (TUBING) ×1 IMPLANT
SLEEVE Z-THREAD 5X100MM (TROCAR) ×1 IMPLANT
STAPLE RELOAD 45MM BLUE (STAPLE) IMPLANT
SUT ETHILON 2 0 FS 18 (SUTURE) IMPLANT
SUT MNCRL AB 4-0 PS2 18 (SUTURE) ×1 IMPLANT
SUT VICRYL 0 AB UR-6 (SUTURE) ×1 IMPLANT
SYS BAG RETRIEVAL 10MM (BASKET) ×1
SYSTEM BAG RETRIEVAL 10MM (BASKET) ×1 IMPLANT
TRAP FLUID SMOKE EVACUATOR (MISCELLANEOUS) ×1 IMPLANT
TRAY FOLEY MTR SLVR 16FR STAT (SET/KITS/TRAYS/PACK) ×1 IMPLANT
TROCAR Z-THREAD FIOS 12X100MM (TROCAR) ×1 IMPLANT
TROCAR Z-THREAD FIOS 5X100MM (TROCAR) ×1 IMPLANT
WATER STERILE IRR 500ML POUR (IV SOLUTION) ×1 IMPLANT

## 2023-09-15 NOTE — Anesthesia Preprocedure Evaluation (Signed)
Anesthesia Evaluation  Patient identified by MRN, date of birth, ID band Patient awake    Reviewed: Allergy & Precautions, H&P , NPO status , Patient's Chart, lab work & pertinent test results  Airway Mallampati: II  TM Distance: >3 FB Neck ROM: full    Dental no notable dental hx.    Pulmonary Current Smoker and Patient abstained from smoking.   Pulmonary exam normal        Cardiovascular negative cardio ROS Normal cardiovascular exam     Neuro/Psych negative neurological ROS  negative psych ROS   GI/Hepatic Neg liver ROS,,,  Endo/Other  negative endocrine ROS    Renal/GU      Musculoskeletal   Abdominal  (+)  Abdomen: tender.   Peds  Hematology negative hematology ROS (+)   Anesthesia Other Findings ACUTE APPENDICITIS  Past Medical History: No date: Anemia No date: Ovarian cyst  Past Surgical History: No date: TONSILLECTOMY  BMI    Body Mass Index: 25.60 kg/m      Reproductive/Obstetrics negative OB ROS                              Anesthesia Physical Anesthesia Plan  ASA: 2  Anesthesia Plan: General ETT   Post-op Pain Management: Ofirmev IV (intra-op)* and Toradol IV (intra-op)*   Induction: Intravenous  PONV Risk Score and Plan: 3 and Ondansetron, Dexamethasone and Midazolam  Airway Management Planned: Oral ETT  Additional Equipment:   Intra-op Plan:   Post-operative Plan: Extubation in OR  Informed Consent: I have reviewed the patients History and Physical, chart, labs and discussed the procedure including the risks, benefits and alternatives for the proposed anesthesia with the patient or authorized representative who has indicated his/her understanding and acceptance.     Dental Advisory Given  Plan Discussed with: CRNA and Surgeon  Anesthesia Plan Comments:          Anesthesia Quick Evaluation

## 2023-09-15 NOTE — ED Provider Notes (Signed)
[facility] Emergency Department Provider Note     Event Date/Time   First MD Initiated Contact with Patient 09/15/23 1502     (approximate)   History   Abdominal Pain   HPI  Courtney [name] is a 17 y.o. female with a history of anemia presents to the ED with acute right lower quadrant abdominal pain with radiation to the left side.  Associated symptoms includes vomiting.  Denies fever and chills.     Physical Exam   Triage Vital Signs: ED Triage Vitals  Encounter Vitals Group     BP 09/15/23 1407 130/72     Systolic BP Percentile --      Diastolic BP Percentile --      Pulse Rate 09/15/23 1405 82     Resp 09/15/23 1405 17     Temp 09/15/23 1405 98 F (36.7 C)     Temp Source 09/15/23 1405 Oral     SpO2 09/15/23 1405 98 %     Weight 09/15/23 1406 139 lb 15.9 oz (63.5 kg)     Height 09/15/23 1406 5\' 2"  (1.575 m)     Head Circumference --      Peak Flow --      Pain Score 09/15/23 1406 8     Pain Loc --      Pain Education --      Exclude from Growth Chart --     Most recent vital signs: Vitals:   09/15/23 1407 09/15/23 1705  BP: 130/72 104/70  Pulse:  69  Resp:  15  Temp:    SpO2:  97%    General Awake, no distress.  Actively vomiting. HEENT NCAT. PERRL. EOMI. No rhinorrhea. Mucous membranes are moist.  CV:  Good peripheral perfusion.  RESP:  Normal effort.  ABD:  No distention.  Soft. tender out of proportion right lower quadrant    ED Results / Procedures / Treatments   Labs (all labs ordered are listed, but only abnormal results are displayed) Labs Reviewed  COMPREHENSIVE METABOLIC PANEL - Abnormal; Notable for the following components:      Result Value   Sodium 133 (*)    Glucose, Bld 109 (*)    All other components within normal limits  CBC - Abnormal; Notable for the following components:   WBC 12.5 (*)    Platelets 123 (*)    All other components within normal limits  URINALYSIS, ROUTINE W REFLEX  MICROSCOPIC - Abnormal; Notable for the following components:   Color, Urine YELLOW (*)    APPearance HAZY (*)    Hgb urine dipstick MODERATE (*)    Ketones, ur 20 (*)    Leukocytes,Ua TRACE (*)    Bacteria, UA FEW (*)    All other components within normal limits  LIPASE, BLOOD  POC URINE PREG, ED   RADIOLOGY  I personally viewed and evaluated these images as part of my medical decision making, as well as reviewing the written report by the radiologist.  ED Provider Interpretation: Noted enlargement of the appendix.  Will confirm with radiology read  CT ABDOMEN PELVIS W CONTRAST Result Date: 09/15/2023 CLINICAL DATA:  Right lower quadrant abdominal pain. EXAM: CT ABDOMEN AND PELVIS WITH CONTRAST TECHNIQUE: Multidetector CT imaging of the abdomen and pelvis was performed using the standard protocol following bolus administration of intravenous contrast. RADIATION DOSE REDUCTION: This exam was performed according to the departmental dose-optimization program which includes automated exposure control, adjustment of the mA  and/or kV according to patient size and/or use of iterative reconstruction technique. CONTRAST:  80mL OMNIPAQUE IOHEXOL 300 MG/ML  SOLN COMPARISON:  CT abdomen pelvis dated 01/28/2022. FINDINGS: Lower chest: The visualized lung bases are clear. No intra-abdominal free air or free fluid. Hepatobiliary: The liver is unremarkable. No biliary ductal dilatation. The gallbladder is unremarkable. Pancreas: Unremarkable. No pancreatic ductal dilatation or surrounding inflammatory changes. Spleen: Normal in size without focal abnormality. Adrenals/Urinary Tract: The adrenal glands are unremarkable. The kidneys, visualized ureters, and urinary bladder appear unremarkable. Stomach/Bowel: There is no bowel obstruction. The appendix is mildly enlarged and inflamed measuring up to 9 mm in diameter. There is a stone in the base of the appendix. The appendix is located in the right hemipelvis  medial to the cecum and ascending colon and extends upward along the right lateral pelvic sidewall. No drainable fluid collection/abscess. No perforation. Vascular/Lymphatic: The abdominal aorta and IVC are unremarkable. No portal venous gas. There is no adenopathy. Reproductive: The uterus is anteverted. An intrauterine device is noted. No suspicious adnexal masses. Other: None Musculoskeletal: No acute or significant osseous findings. IMPRESSION: Acute appendicitis.  No abscess or perforation. Electronically Signed   By: Elgie Collard M.D.   On: 09/15/2023 16:01    PROCEDURES:  Critical Care performed: No  Procedures   MEDICATIONS ORDERED IN ED: Medications  cefoTEtan (CEFOTAN) 1 g in sodium chloride 0.9 % 100 mL IVPB (has no administration in time range)  sodium chloride 0.9 % bolus 1,000 mL (0 mLs Intravenous Stopped 09/15/23 1647)  ondansetron (ZOFRAN) injection 4 mg (4 mg Intravenous Given 09/15/23 1540)  iohexol (OMNIPAQUE) 300 MG/ML solution 80 mL (80 mLs Intravenous Contrast Given 09/15/23 1547)  ketorolac (TORADOL) 30 MG/ML injection 15 mg (15 mg Intravenous Given 09/15/23 1650)  metoCLOPramide (REGLAN) injection 10 mg (10 mg Intravenous Given 09/15/23 1701)     IMPRESSION / MDM / ASSESSMENT AND PLAN / ED COURSE  I reviewed the triage vital signs and the nursing notes.                              Clinical Course as of 09/15/23 1718  Tue Sep 15, 2023  1612 Consult General surgery to see patient  [MH]    Clinical Course User Index [MH] [name], PA-C    17 y.o. female presents to the emergency department for evaluation and treatment of acute right lower quadrant pain. See HPI for further details.   Differential diagnosis includes, but is not limited to, ovarian cyst, ovarian torsion, acute appendicitis, diverticulitis, urinary tract infection/pyelonephritis, bowel obstruction, colitis, renal colic, gastroenteritis,    Patient's presentation is most  consistent with acute complicated illness / injury requiring diagnostic workup.  Patient is alert and oriented.  She is hemodynamically stable and afebrile.  Lab work is pertinent for a mildly elevated WBC.  Physical exam is pertinent for right lower quadrant tenderness.  Will obtain a CT abdomen pelvis.  CT abdomen pelvis notes acute appendicitis.  General Surgery consulted. Spoke with Dr. Maurine Minister who will come in and take patient to the OR for Appendectomy.  Transferring care.    FINAL CLINICAL IMPRESSION(S) / ED DIAGNOSES   Final diagnoses:  None   Rx / DC Orders   ED Discharge Orders     None       Note:  This document was prepared using Dragon voice recognition software and may include unintentional dictation errors.  Romeo Apple, Burnice Oestreicher A, PA-C 09/15/23 Isaac Laud, MD 09/22/23 2009

## 2023-09-15 NOTE — Anesthesia Procedure Notes (Signed)
Procedure Name: Intubation Date/Time: 09/15/2023 5:50 PM  Performed by: Katherine Basset, CRNAPre-anesthesia Checklist: Patient identified, Emergency Drugs available, Suction available and Patient being monitored Patient Re-evaluated:Patient Re-evaluated prior to induction Oxygen Delivery Method: Circle system utilized Preoxygenation: Pre-oxygenation with 100% oxygen Induction Type: IV induction and Rapid sequence Ventilation: Mask ventilation without difficulty Laryngoscope Size: Miller and 2 Grade View: Grade I Tube type: Oral Tube size: 7.0 mm Number of attempts: 1 Airway Equipment and Method: Stylet, Oral airway and Bite block Placement Confirmation: ETT inserted through vocal cords under direct vision, positive ETCO2 and breath sounds checked- equal and bilateral Secured at: 21 cm Tube secured with: Tape Dental Injury: Teeth and Oropharynx as per pre-operative assessment

## 2023-09-15 NOTE — ED Provider Notes (Signed)
Medical screening examination/treatment/procedure(s) were conducted as a shared visit with non-physician practitioner(s) and myself.  I personally evaluated the patient during the encounter.  Plan: consult surgery, concern for possible appendicitis   Sharyn Creamer, MD 09/15/23 [phone]

## 2023-09-15 NOTE — ED Triage Notes (Signed)
Pt here with abd pain that started yesterday. Pt states pain starts on her right side and radiates across her abd to her left side. Pt endorses N and V. Pt denies fevers.

## 2023-09-15 NOTE — Op Note (Signed)
Operative note Preoperative diagnosis: Acute appendicitis Postoperative diagnosis: Acute appendicitis Surgeon: Baker Pierini, MD EBL: 5 cc Procedure: Laparoscopic appendectomy  After informed consent was obtained the patient was brought to the operating room placed supine on the operating room table.  General endotracheal anesthesia was induced and the abdomen was then prepped and draped in the usual sterile fashion.  A surgical timeout was called did find correct patient, site, side and procedure.  An incision was made in the left upper quadrant at Palmer's point.  A Veress needle was inserted into the abdomen using standard drop technique.  Pneumoperitoneum was then established.  A 5 mm infraumbilical incision was made and an Optiview trocar was placed at this site.  The abdomen was then surveyed and there was noted to be no injury at the Veress needle insertion site.  A 12 mm left lower quadrant and 5 mm suprapubic port were then placed under direct visualization.  The appendix did appear inflamed but there was no evidence of perforation.  The mesoappendix was grasped and elevated towards the anterior abdominal wall.  A window was made in the mesoappendix.  The appendix was then stapled off with a 45 mm Endo GIA stapler blue load.  The mesoappendix was then taken with a LigaSure device.  The appendix was then placed in an Endo Catch bag.  The right lower quadrant was then suctioned out of any fluid and blood.  Hemostasis was obtained.  The appendix was removed and passed off the table specimen.  The fascia of the 12 mm left lower quadrant port was then closed with 0 Vicryl on a suture needle passer.  The preperitoneal space was then infiltrated with 20 cc of liposomal bupivacaine Marcaine solution.  The abdomen was then allowed to be desufflated and the skin was closed with 4-0 Monocryl.  Prior to closure all sponge and instrument counts were correct x 2

## 2023-09-15 NOTE — Transfer of Care (Signed)
Immediate Anesthesia Transfer of Care Note  Patient: Courtney [name]  Procedure(s) Performed: APPENDECTOMY LAPAROSCOPIC  Patient Location: PACU  Anesthesia Type:General  Level of Consciousness: drowsy  Airway & Oxygen Therapy: Patient Spontanous Breathing and Patient connected to nasal cannula oxygen  Post-op Assessment: Report given to RN and Post -op Vital signs reviewed and stable  Post vital signs: Reviewed and stable  Last Vitals:  Vitals Value Taken Time  BP 105/50 09/15/23 2007  Temp 36.3 C 09/15/23 2006  Pulse 68 09/15/23 2009  Resp 18 09/15/23 2009  SpO2 100 % 09/15/23 2009  Vitals shown include unfiled device data.  Last Pain:  Vitals:   09/15/23 2006  TempSrc: Temporal  PainSc: 0-No pain         Complications: No notable events documented.

## 2023-09-15 NOTE — Anesthesia Postprocedure Evaluation (Signed)
Anesthesia Post Note  Patient: Courtney [name]  Procedure(s) Performed: APPENDECTOMY LAPAROSCOPIC  Patient location during evaluation: PACU Anesthesia Type: General Level of consciousness: awake and alert Pain management: pain level controlled Vital Signs Assessment: post-procedure vital signs reviewed and stable Respiratory status: spontaneous breathing, nonlabored ventilation and respiratory function stable Cardiovascular status: blood pressure returned to baseline and stable Postop Assessment: no apparent nausea or vomiting Anesthetic complications: no   No notable events documented.   Last Vitals:  Vitals:   09/15/23 2000 09/15/23 2006  BP: (!) 89/65 (!) 105/50  Pulse: 80 62  Resp: 14 17  Temp:  (!) 36.3 C  SpO2: 100% 100%    Last Pain:  Vitals:   09/15/23 2006  TempSrc: Temporal  PainSc: 0-No pain                 Foye Deer

## 2023-09-15 NOTE — H&P (Signed)
Patient ID: Courtney [name], female   DOB: 03-05-2006, 17 y.o.   MRN: [id number] CC: Abdominal pain History of Present Illness Courtney [name] is a 17 y.o. female with no significant past medical history that presents with 1 day history of abdominal pain.  The patient says that she started to have abdominal pain last night.  It was sharp in nature and centered throughout her whole abdomen.  This was also associated with nausea and vomiting.  She also reports that she has had fevers and chills today.  She denies any dysuria but did have some diarrhea.  In the ED she was found to have a CT scan that was concerning for appendicitis and a leukocytosis..  Past Medical History Past Medical History:  Diagnosis Date   Anemia    Ovarian cyst        Past Surgical History:  Procedure Laterality Date   TONSILLECTOMY      Allergies  Allergen Reactions   Sulfa Antibiotics Hives    No current facility-administered medications for this encounter.   Current Outpatient Medications  Medication Sig Dispense Refill   levonorgestrel (KYLEENA) 19.5 MG IUD 1 each by Intrauterine route once.     norethindrone (MICRONOR) 0.35 MG tablet Take 1 tablet (0.35 mg total) by mouth daily. 84 tablet 0    Family History Family History  Problem Relation Age of Onset   Hypertension Mother    Asthma Mother    Diabetes Maternal Grandmother    Diabetes Paternal Grandmother        Social History Social History   Tobacco Use   Smoking status: Never   Smokeless tobacco: Never  Vaping Use   Vaping status: Former  Substance Use Topics   Alcohol use: No   Drug use: Yes    Types: Marijuana        ROS Full ROS of systems performed and is otherwise negative there than what is stated in the HPI  Physical Exam Blood pressure 104/70, pulse 69, temperature 98 F (36.7 C), temperature source Oral, resp. rate 15, height 5\' 2"  (1.575 m), weight 63.5 kg, SpO2 97%.  No acute distress, alert and oriented x  3, moving all extremity spontaneously, normal work of breathing on room air, regular rate and rhythm, abdomen is soft, tender to palpation in the right lower quadrant with rebound tenderness and focal peritonitis.  No surgical scars Data Reviewed I reviewed her labs notable for a leukocytosis of 12,000.  She also has a CT scan that shows a dilated inflamed appendix with a thickened wall.  I have personally reviewed the patient's imaging and medical records.    Assessment    17 year old female with appendicitis  Plan    I discussed the risk, benefits alternatives of the laparoscopic appendectomy including risk of infection, bleeding, damage to the small large bowel with the patient I also discussed with her that if it is not perforated then she can possibly go home tonight.  If it is perforated then I prefer the patient is to stay for 24 hours of perioperative antibiotics.  She understands these risks and wishes to proceed with surgery    Kandis Cocking 09/15/2023, 5:14 PM

## 2023-09-16 ENCOUNTER — Encounter: Payer: Self-pay | Admitting: General Surgery

## 2023-09-17 ENCOUNTER — Other Ambulatory Visit: Payer: Self-pay

## 2023-09-18 LAB — SURGICAL PATHOLOGY

## 2023-09-21 ENCOUNTER — Telehealth: Payer: Self-pay | Admitting: General Surgery

## 2023-09-21 NOTE — Telephone Encounter (Signed)
Called patient back, message left that she should stop the Oxycodone pain medication. She may take Benadryl orally and also may use Benadryl cream to the rash. She may call back with any further questions.

## 2023-09-21 NOTE — Telephone Encounter (Signed)
Patient called the office today.  She had appendectomy on 12/24 with Dr. Maurine Minister.    Her discharge paperwork states to call the doctor if she develops a rash.  Patient complaint of rash (red bumps) on chest and thighs.

## 2023-10-01 ENCOUNTER — Encounter: Payer: MEDICAID | Admitting: General Surgery

## 2023-10-06 ENCOUNTER — Ambulatory Visit (INDEPENDENT_AMBULATORY_CARE_PROVIDER_SITE_OTHER): Payer: MEDICAID | Admitting: General Surgery

## 2023-10-06 ENCOUNTER — Encounter: Payer: Self-pay | Admitting: General Surgery

## 2023-10-06 VITALS — BP 116/70 | HR 98 | Temp 98.7°F | Ht 62.0 in | Wt 136.6 lb

## 2023-10-06 DIAGNOSIS — Z09 Encounter for follow-up examination after completed treatment for conditions other than malignant neoplasm: Secondary | ICD-10-CM

## 2023-10-06 DIAGNOSIS — K352 Acute appendicitis with generalized peritonitis, without perforation or abscess: Secondary | ICD-10-CM

## 2023-10-06 DIAGNOSIS — K3580 Unspecified acute appendicitis: Secondary | ICD-10-CM

## 2023-10-06 NOTE — Patient Instructions (Signed)

## 2023-10-07 NOTE — Progress Notes (Signed)
 Courtney [name] returns today status post laparoscopic appendectomy.  She reports doing well.  She denies any nausea or vomiting.  She denies any problems with her wounds.  Her pain is gone.  Abdomen is soft, nontender and nondistended.  Her laparoscopic port sites are healing well.  Pathology discussed with patient and consistent with acute appendicitis.  She can follow-up with us  on an as-needed basis.  Continue lifting restrictions for 2 more weeks of no greater than 10 to 15 pounds

## 2023-10-28 ENCOUNTER — Ambulatory Visit
Admission: EM | Admit: 2023-10-28 | Discharge: 2023-10-28 | Disposition: A | Discharge status: Home / Self Care | Payer: MEDICAID | Attending: Family Medicine | Admitting: Family Medicine

## 2023-10-28 DIAGNOSIS — K529 Noninfective gastroenteritis and colitis, unspecified: Secondary | ICD-10-CM | POA: Diagnosis not present

## 2023-10-28 MED ORDER — PROMETHAZINE HCL 12.5 MG PO TABS: 12.5000 mg | ORAL_TABLET | Freq: Four times a day (QID) | ORAL | 0 refills | Status: DC | PRN

## 2023-10-28 NOTE — ED Provider Notes (Signed)
 [facility]    CSN: [id number] Arrival date & time: 10/28/23  1556      History   Chief Complaint Chief Complaint  Patient [name] with   Emesis   Diarrhea    HPI Courtney [name] is a 18 y.o. female.   HPI  History obtained from the patient. Courtney [name] for vomiting and diarrhea since 7 AM. She was not able to keep water or pedialyte down.  She hasn't vomited in 2 hours but also hasn't eaten or drank anything in that time.  Last night she ate peanut butter.  No cough, rhinorrhea, sore throat. Has some stabby abdominal pain that is rated 5/10.  Took phenergan  and Zofran  but only the Phenergan  helped.    Last period was 10/04/22  (Menstrual status: IUD).  She had an appendicitis.       Past Medical History:  Diagnosis Date   Anemia    Ovarian cyst     Patient Active Problem List   Diagnosis Date Noted   Acute appendicitis with generalized peritonitis without gangrene, perforation, or abscess 09/15/2023   Dysmenorrhea 03/27/2023    Past Surgical History:  Procedure Laterality Date   LAPAROSCOPIC APPENDECTOMY N/A 09/15/2023   Procedure: APPENDECTOMY LAPAROSCOPIC;  Surgeon: Marinda Jayson KIDD, MD;  Location: [facility] ORS;  Service: General;  Laterality: N/A;   TONSILLECTOMY      OB History     Gravida  0   Para  0   Term  0   Preterm  0   AB  0   Living  0      SAB  0   IAB  0   Ectopic  0   Multiple  0   Live Births  0            Home Medications    Prior to Admission medications   Medication Sig Start Date End Date Taking? Authorizing Provider  levonorgestrel  (KYLEENA ) 19.5 MG IUD 1 each by Intrauterine route once. 04/14/23 04/13/28 Yes Copland, Alicia B, PA-C  promethazine  (PHENERGAN ) 12.5 MG tablet Take 1 tablet (12.5 mg total) by mouth every 6 (six) hours as needed for nausea or vomiting. 10/28/23  Yes Latasha Buczkowski, DO  norethindrone  (MICRONOR ) 0.35 MG tablet Take 1 tablet (0.35 mg total) by mouth daily. 08/13/23    Copland, Bernarda NOVAK, PA-C    Family History Family History  Problem Relation Age of Onset   Hypertension Mother    Asthma Mother    Diabetes Maternal Grandmother    Diabetes Paternal Grandmother     Social History Social History   Tobacco Use   Smoking status: Never   Smokeless tobacco: Never  Vaping Use   Vaping status: Former  Substance Use Topics   Alcohol use: No   Drug use: Yes    Types: Marijuana     Allergies   Oxycodone  and Sulfa antibiotics   Review of Systems Review of Systems: negative unless otherwise stated in HPI.      Physical Exam Triage Vital Signs ED Triage Vitals  Encounter Vitals Group     BP      Systolic BP Percentile      Diastolic BP Percentile      Pulse      Resp      Temp      Temp src      SpO2      Weight      Height      Head Circumference  Peak Flow      Pain Score      Pain Loc      Pain Education      Exclude from Growth Chart    No data found.  Updated Vital Signs BP 101/69 (BP Location: Right Arm)   Pulse 94   Temp 97.6 F (36.4 C) (Oral)   Resp 17   SpO2 98%   Visual Acuity Right Eye Distance:   Left Eye Distance:   Bilateral Distance:    Right Eye Near:   Left Eye Near:    Bilateral Near:     Physical Exam GEN:     alert, non-toxic appearing female in no distress    HENT:  mucus membranes moist, oropharyngeal without lesions or erythema, no tonsillar hypertrophy or exudates EYES:   pupils equal and reactive, no scleral injection or discharge NECK:  normal ROM, no lymphadenopathy, no meningismus   RESP:  no increased work of breathing, clear to auscultation bilaterally CVS:   regular rate and rhythm ABD:   Soft, nontender, nondistended, no guarding, no rebound, active bowel sounds throughout, negative McBurney's, negative Murphy's Skin:   warm and dry, no rash on visible skin    UC Treatments / Results  Labs (all labs ordered are listed, but only abnormal results are displayed) Labs  Reviewed - No data to display  EKG   Radiology No results found.  Procedures Procedures (including critical care time)  Medications Ordered in UC Medications - No data to display  Initial Impression / Assessment and Plan / UC Course  I have reviewed the triage vital signs and the nursing notes.  Pertinent labs & imaging results that were available during my care of the patient were reviewed by me and considered in my medical decision making (see chart for details).       Pt is a 18 y.o. female who [name] for abdominal pain with vomiting and diarrhea that started today.     On chart review, she had an appendectomy on 09/15/2023.  She was seen outpatient with general surgery and no longer on lifting restrictions.  [name] is afebrile here without recent antipyretics. Satting well on room air. Overall pt is non-toxic appearing, well hydrated, without respiratory distress. Abdominal  exam \\is  unremarkable.  COVID and influenza panel deferred as she has no respiratory symptoms. Requests Phenergan  prescribed for nausea and vomiting which was prescribed as below.  Discussed symptomatic treatment.   Typical duration of symptoms discussed.   Return and ED precautions given and voiced understanding. Discussed MDM, treatment plan and plan for follow-up with patient who agrees with plan.     Final Clinical Impressions(s) / UC Diagnoses   Final diagnoses:  Gastroenteritis     Discharge Instructions      Stop by the pharmacy to pick up your prescriptions.  If diarrhea lasts more than 10 days, return to urgent care or see primary care provider for stool sample.         ED Prescriptions     Medication Sig Dispense Auth. Provider   promethazine  (PHENERGAN ) 12.5 MG tablet Take 1 tablet (12.5 mg total) by mouth every 6 (six) hours as needed for nausea or vomiting. 30 tablet Ilir Mahrt, DO      PDMP not reviewed this encounter.   Mayu Ronk, DO 10/31/23 1328

## 2023-10-28 NOTE — ED Triage Notes (Addendum)
 Vomiting abdominal pain and diarrhea since 7. Numerous times. Appendectomy on 12-7.

## 2023-10-28 NOTE — Discharge Instructions (Signed)
 Stop by the pharmacy to pick up your prescriptions.  If diarrhea lasts more than 10 days, return to urgent care or see primary care provider for stool sample.

## 2023-11-07 ENCOUNTER — Encounter: Payer: Self-pay | Admitting: Obstetrics and Gynecology

## 2023-12-28 NOTE — Progress Notes (Unsigned)
   No chief complaint on file.    History of Present Illness:  Courtney [name] is a 18 y.o. that had a {IUD:23561} IUD placed approximately {NUMBERS:20191} {MONTH/YR:310907} ago. Since that time, she denies dyspareunia, pelvic pain, non-menstrual bleeding, vaginal d/c, heavy bleeding.  Kyleena  IUD placed 04/14/23  wants to conceive  There were no vitals taken for this visit.  Pelvic exam:  Two IUD strings {DESC; PRESENT/ABSENT:17923} seen coming from the cervical os. EGBUS, vaginal vault and cervix: within normal limits  IUD Removal Strings of IUD identified and grasped.  IUD removed without problem with ring forceps.  Pt tolerated this well.  IUD noted to be intact.  Assessment:  No diagnosis found.    Plan: IUD removed and plan for contraception is {PLAN CONTRACEPTION:313102}. She was amenable to this plan.  Iman Orourke B. Cumi Sanagustin, PA-C 12/28/2023 5:07 PM

## 2023-12-29 ENCOUNTER — Ambulatory Visit (INDEPENDENT_AMBULATORY_CARE_PROVIDER_SITE_OTHER): Payer: MEDICAID | Admitting: Obstetrics and Gynecology

## 2023-12-29 ENCOUNTER — Encounter: Payer: Self-pay | Admitting: Obstetrics and Gynecology

## 2023-12-29 ENCOUNTER — Other Ambulatory Visit (HOSPITAL_COMMUNITY)
Admission: RE | Admit: 2023-12-29 | Discharge: 2023-12-29 | Disposition: A | Discharge status: Home / Self Care | Payer: MEDICAID | Source: Ambulatory Visit | Attending: Obstetrics and Gynecology | Admitting: Obstetrics and Gynecology

## 2023-12-29 VITALS — BP 110/68 | Ht 62.0 in | Wt 132.0 lb

## 2023-12-29 DIAGNOSIS — Z113 Encounter for screening for infections with a predominantly sexual mode of transmission: Secondary | ICD-10-CM

## 2023-12-29 DIAGNOSIS — Z30011 Encounter for initial prescription of contraceptive pills: Secondary | ICD-10-CM

## 2023-12-29 DIAGNOSIS — Z30432 Encounter for removal of intrauterine contraceptive device: Secondary | ICD-10-CM

## 2023-12-29 MED ORDER — NORETHINDRONE 0.35 MG PO TABS: 1.0000 | ORAL_TABLET | Freq: Every day | ORAL | 0 refills | Status: DC

## 2023-12-29 NOTE — Patient Instructions (Signed)
 I value your feedback and you entrusting [place] with your care. If you get a [place] patient survey, I would appreciate you taking the time to let us know about your experience today. Thank you! ? ? ?

## 2023-12-30 LAB — CERVICOVAGINAL ANCILLARY ONLY
Chlamydia: NEGATIVE
Comment: NEGATIVE
Comment: NORMAL
Neisseria Gonorrhea: NEGATIVE

## 2024-01-01 ENCOUNTER — Encounter: Payer: Self-pay | Admitting: Emergency Medicine

## 2024-01-01 ENCOUNTER — Ambulatory Visit
Admission: EM | Admit: 2024-01-01 | Discharge: 2024-01-01 | Disposition: A | Discharge status: Home / Self Care | Payer: MEDICAID | Attending: Emergency Medicine | Admitting: Emergency Medicine

## 2024-01-01 DIAGNOSIS — J069 Acute upper respiratory infection, unspecified: Secondary | ICD-10-CM

## 2024-01-01 DIAGNOSIS — Z720 Tobacco use: Secondary | ICD-10-CM | POA: Diagnosis not present

## 2024-01-01 MED ORDER — ALBUTEROL SULFATE HFA 108 (90 BASE) MCG/ACT IN AERS: 2.0000 | INHALATION_SPRAY | RESPIRATORY_TRACT | 0 refills | Status: DC | PRN

## 2024-01-01 MED ORDER — BENZONATATE 100 MG PO CAPS: 100.0000 mg | ORAL_CAPSULE | Freq: Three times a day (TID) | ORAL | 0 refills | Status: DC

## 2024-01-01 NOTE — ED Provider Notes (Signed)
 [facility]    CSN: [id number] Arrival date & time: 01/01/24  1548      History   Chief Complaint Chief Complaint  Patient presents with   Cough   Sore Throat    HPI ISABEL ARDILA is a 18 y.o. female.   18 year old female, Kenneth, Lax to urgent care for evaluation of cough that is causing sore throat x 3 days.  Patient states she has been around family member who has croup.  Patient endorses vaping.  Using over-the-counter meds for symptom management.  The history is provided by the patient. No language interpreter was used.  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   Acute appendicitis with generalized peritonitis without gangrene, perforation, or abscess 09/15/2023   Dysmenorrhea 03/27/2023    Past Surgical History:  Procedure Laterality Date   LAPAROSCOPIC APPENDECTOMY N/A 09/15/2023   Procedure: APPENDECTOMY LAPAROSCOPIC;  Surgeon: Kandis Cocking, MD;  Location: [facility] ORS;  Service: General;  Laterality: N/A;   TONSILLECTOMY      OB History     Gravida  0   Para  0   Term  0   Preterm  0   AB  0   Living  0      SAB  0   IAB  0   Ectopic  0   Multiple  0   Live Births  0            Home Medications    Prior to Admission medications   Medication Sig Start Date End Date Taking? Authorizing Provider  albuterol (VENTOLIN HFA) 108 (90 Base) MCG/ACT inhaler Inhale 2 puffs into the lungs every 4 (four) hours as needed for wheezing or shortness of breath. 01/01/24  Yes Swetha Rayle, Para March, NP  benzonatate (TESSALON) 100 MG capsule Take 1 capsule (100 mg total) by mouth every 8 (eight) hours. 01/01/24  Yes Dasie Chancellor, Para March, NP  norethindrone (MICRONOR) 0.35 MG tablet Take 1 tablet (0.35 mg total) by mouth daily. 12/29/23  Yes Copland, Ilona Sorrel,  PA-C    Family History Family History  Problem Relation Age of Onset   Hypertension Mother    Asthma Mother    Diabetes Maternal Grandmother    Diabetes Paternal Grandmother     Social History Social History   Tobacco Use   Smoking status: Never   Smokeless tobacco: Never  Vaping Use   Vaping status: Former  Substance Use Topics   Alcohol use: No   Drug use: Not Currently    Types: Marijuana     Allergies   Oxycodone and Sulfa antibiotics   Review of Systems Review of Systems  Constitutional:  Negative for fever.  HENT:  Positive for sore throat. Negative for congestion.   Respiratory:  Positive for cough and wheezing.   All other systems reviewed and are negative.    Physical Exam Triage Vital Signs ED Triage Vitals  Encounter Vitals Group     BP 01/01/24 1605 121/74     Systolic BP Percentile --      Diastolic BP Percentile --      Pulse Rate 01/01/24 1605 89     Resp 01/01/24 1605 16     Temp 01/01/24 1605 98.9 F (37.2 C)  Temp Source 01/01/24 1605 Oral     SpO2 01/01/24 1605 97 %     Weight --      Height --      Head Circumference --      Peak Flow --      Pain Score 01/01/24 1603 6     Pain Loc --      Pain Education --      Exclude from Growth Chart --    No data found.  Updated Vital Signs BP 121/74 (BP Location: Left Arm)   Pulse 89   Temp 98.9 F (37.2 C) (Oral)   Resp 16   LMP 12/21/2023 (Exact Date)   SpO2 97%   Visual Acuity Right Eye Distance:   Left Eye Distance:   Bilateral Distance:    Right Eye Near:   Left Eye Near:    Bilateral Near:     Physical Exam Vitals and nursing note reviewed.  Constitutional:      General: She is not in acute distress.    Appearance: She is well-developed.  HENT:     Head: Normocephalic.     Right Ear: Tympanic membrane is retracted.     Left Ear: Tympanic membrane is retracted.     Nose: Congestion present.     Mouth/Throat:     Lips: Pink.     Mouth: Mucous membranes are  moist.     Pharynx: Oropharynx is clear.  Eyes:     General: Lids are normal.     Conjunctiva/sclera: Conjunctivae normal.     Pupils: Pupils are equal, round, and reactive to light.  Neck:     Trachea: No tracheal deviation.  Cardiovascular:     Rate and Rhythm: Normal rate and regular rhythm.     Pulses: Normal pulses.     Heart sounds: Normal heart sounds. No murmur heard. Pulmonary:     Effort: Pulmonary effort is normal.     Breath sounds: Normal breath sounds.  Abdominal:     General: Bowel sounds are normal.     Palpations: Abdomen is soft.     Tenderness: There is no abdominal tenderness.  Musculoskeletal:        General: Normal range of motion.     Cervical back: Normal range of motion.  Lymphadenopathy:     Cervical: No cervical adenopathy.  Skin:    General: Skin is warm and dry.     Findings: No rash.  Neurological:     General: No focal deficit present.     Mental Status: She is alert and oriented to person, place, and time.     GCS: GCS eye subscore is 4. GCS verbal subscore is 5. GCS motor subscore is 6.  Psychiatric:        Attention and Perception: Attention normal.        Mood and Affect: Mood normal.        Speech: Speech normal.        Behavior: Behavior normal. Behavior is cooperative.      UC Treatments / Results  Labs (all labs ordered are listed, but only abnormal results are displayed) Labs Reviewed - No data to display  EKG   Radiology No results found.  Procedures Procedures (including critical care time)  Medications Ordered in UC Medications - No data to display  Initial Impression / Assessment and Plan / UC Course  I have reviewed the triage vital signs and the nursing notes.  Pertinent labs & imaging results  that were available during my care of the patient were reviewed by me and considered in my medical decision making (see chart for details).    Discussed exam findings and plan of care with patient, most likely this is  viral in nature no antibiotic is indicated at this time , strict go to ER precautions given.   Patient verbalized understanding to this provider.  Ddx: Viral uri w cough, sore throat,allergies,smoker Final Clinical Impressions(s) / UC Diagnoses   Final diagnoses:  Viral URI with cough  Nicotine vapor product user     Discharge Instructions      Stop vaping,avoid smoke,take daily allergy med of choice, may use flonase nasal spray,use tessalon perles and albuterol inhaler as prescribed.Drink plenty of water, follow up with PCP.  If you develop chest pain, shortness breath, or worsening symptoms go to the emergency room for further evaluation    ED Prescriptions     Medication Sig Dispense Auth. Provider   albuterol (VENTOLIN HFA) 108 (90 Base) MCG/ACT inhaler Inhale 2 puffs into the lungs every 4 (four) hours as needed for wheezing or shortness of breath. 1 each Bevin Das, NP   benzonatate (TESSALON) 100 MG capsule Take 1 capsule (100 mg total) by mouth every 8 (eight) hours. 21 capsule Riann Oman, Para March, NP      PDMP not reviewed this encounter.   Clancy Gourd, NP 01/01/24 1620

## 2024-01-01 NOTE — ED Triage Notes (Signed)
 Pt presents with sore throat and cough x 3 days. Pt denies emesis but does complain of nausea.

## 2024-01-01 NOTE — Discharge Instructions (Addendum)
 Stop vaping,avoid smoke,take daily allergy med of choice, may use flonase nasal spray,use tessalon perles and albuterol inhaler as prescribed.Drink plenty of water, follow up with PCP.  If you develop chest pain, shortness breath, or worsening symptoms go to the emergency room for further evaluation

## 2024-02-08 IMAGING — CT CT ABD-PELV W/ CM
2 of 4 series · 16 of 46 positions shown, 18 images · IV contrast (APPLIED)
Comparison: November 11, 2014

CLINICAL DATA: Sore throat and vomiting with epigastric pain.

EXAM:
CT ABDOMEN AND PELVIS WITH CONTRAST
TECHNIQUE: Multidetector CT imaging of the abdomen and pelvis was performed
using the standard protocol following bolus administration of
intravenous contrast.

[Series 2: routine abd/pel with · axial · 0.73mm/px · z∈[-2,+448]mm · 13 of 100 slices shown, 15 images]
[im 5/100  soft-tissue]
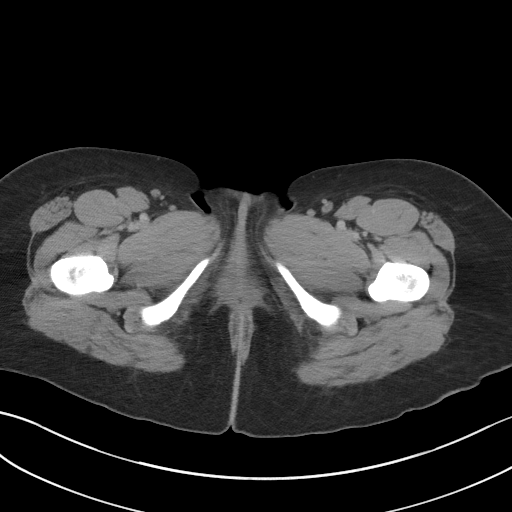
[im 5/100  bone]
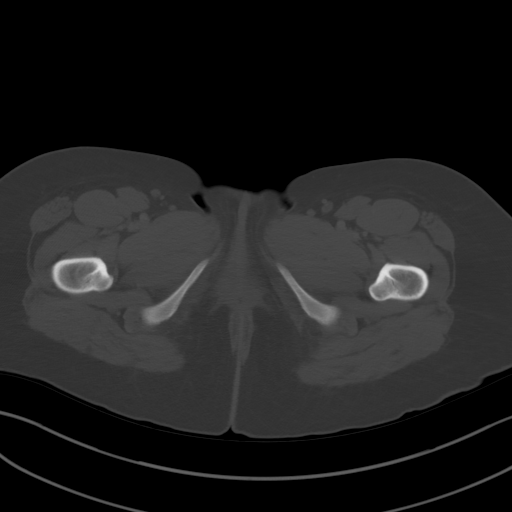
[im 13/100  soft-tissue]
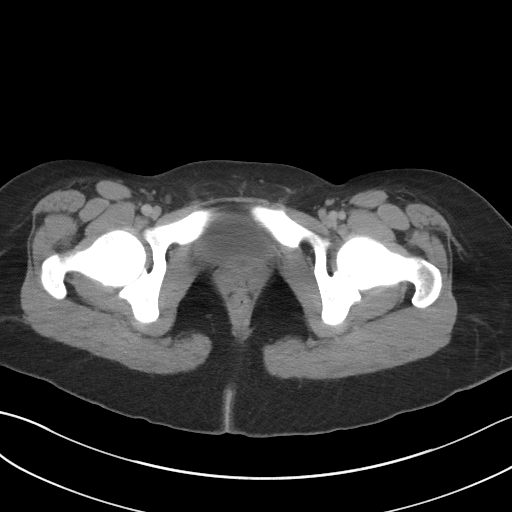
[im 21/100  soft-tissue]
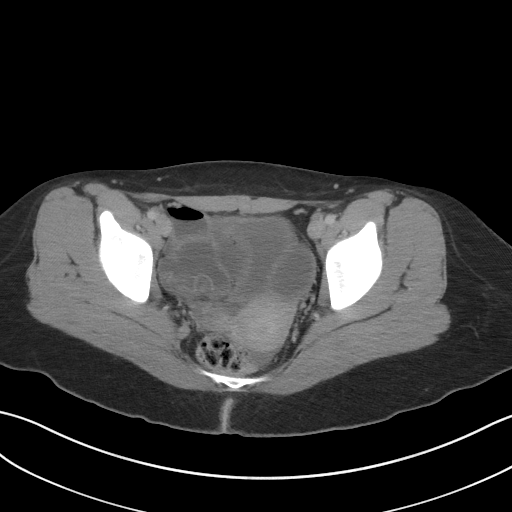
[im 29/100  soft-tissue]
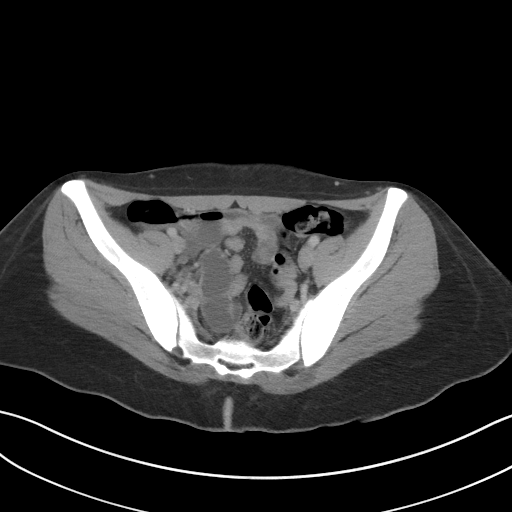
[im 34/100  soft-tissue]
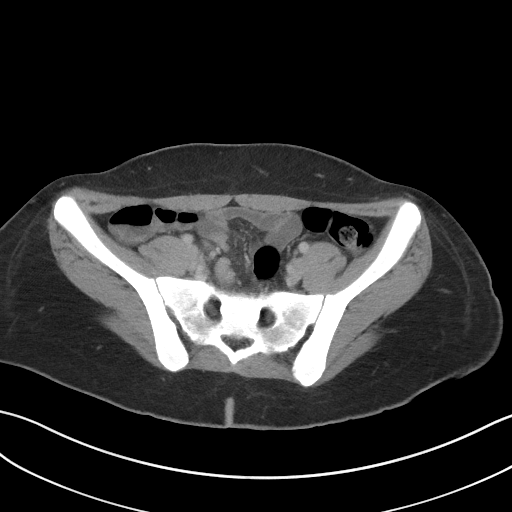
[im 42/100  soft-tissue]
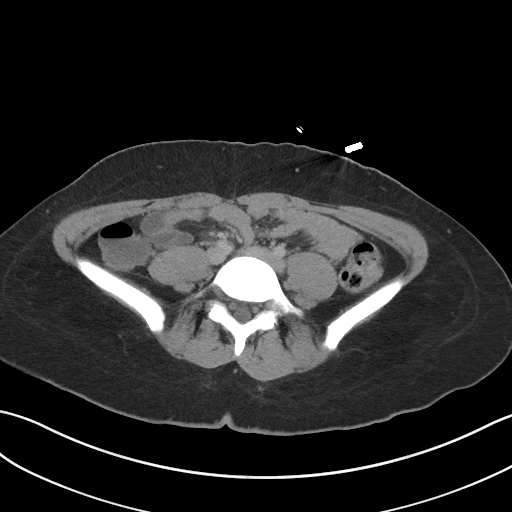
[im 50/100  soft-tissue]
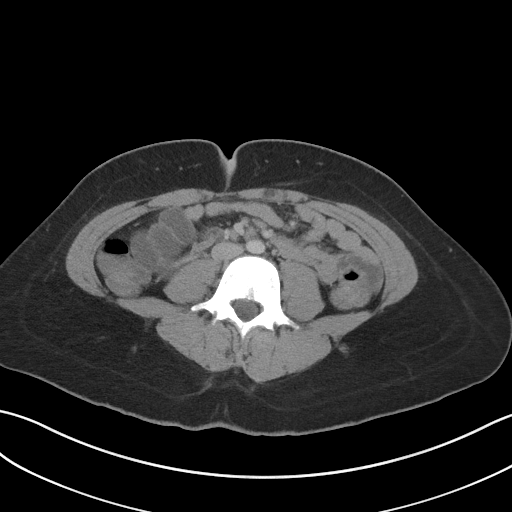
[im 58/100  soft-tissue]
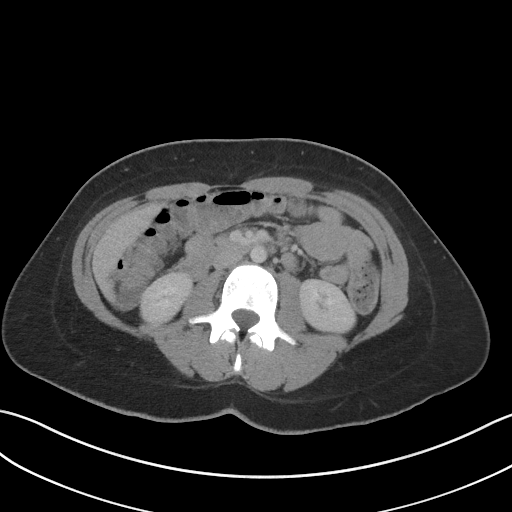
[im 67/100  soft-tissue]
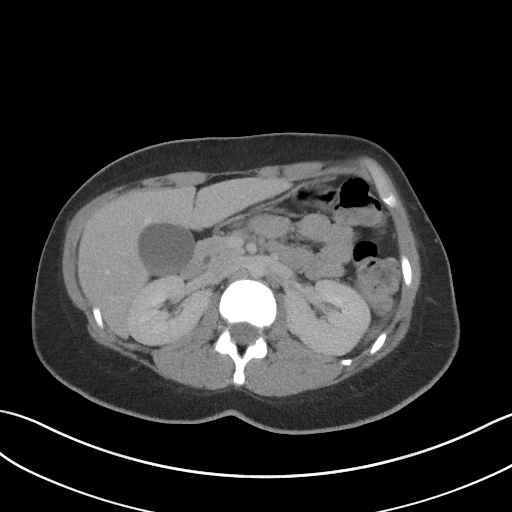
[im 67/100  bone]
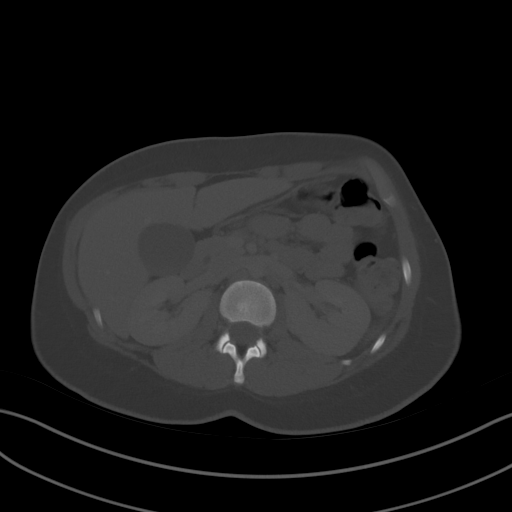
[im 71/100  soft-tissue]
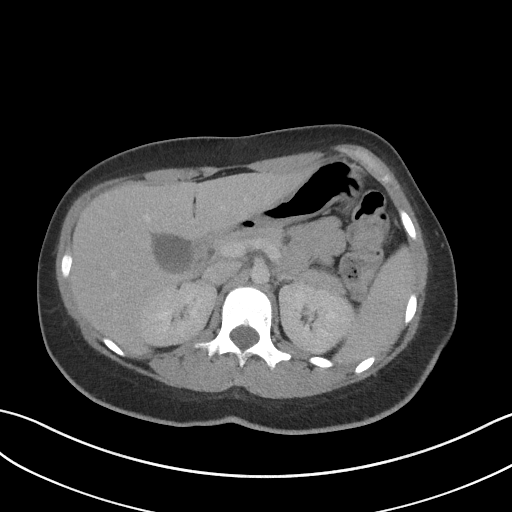
[im 79/100  soft-tissue]
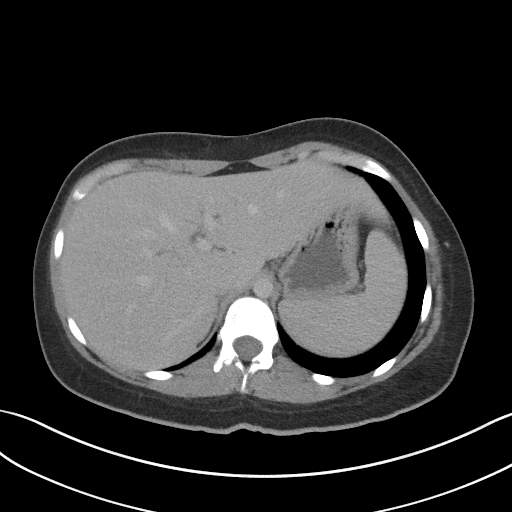
[im 87/100  soft-tissue]
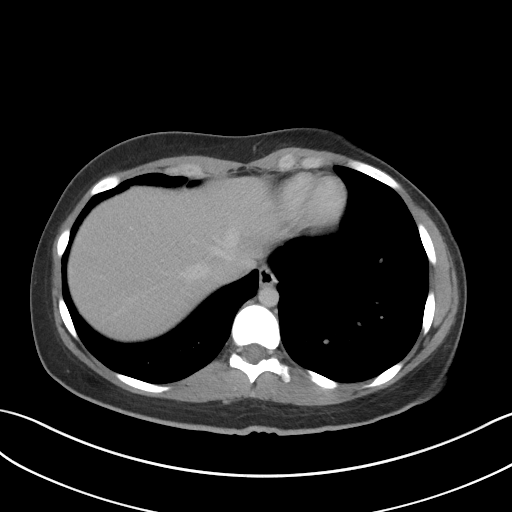
[im 95/100  soft-tissue]
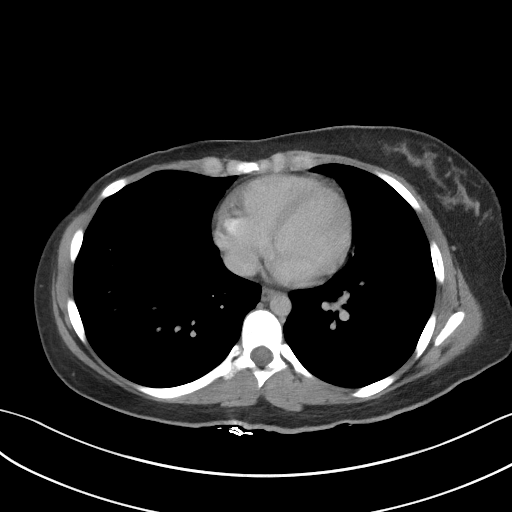

[Series 5: coronal st · coronal · 0.70mm/px · 3 of 69 slices shown]
[im 23/69  soft-tissue]
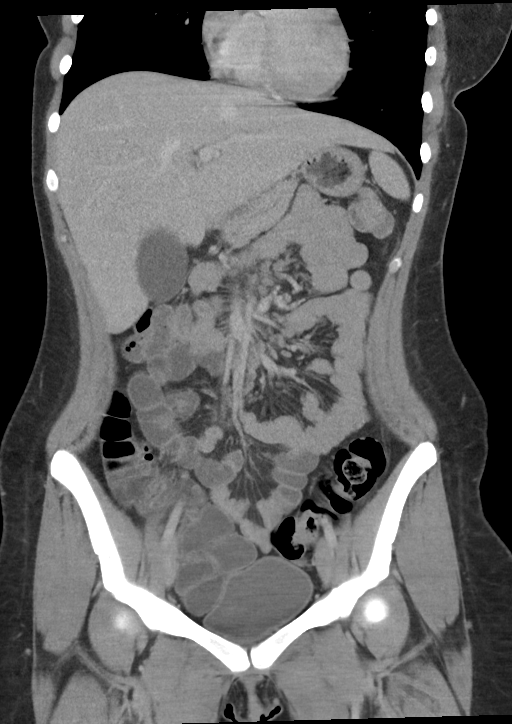
[im 31/69  soft-tissue]
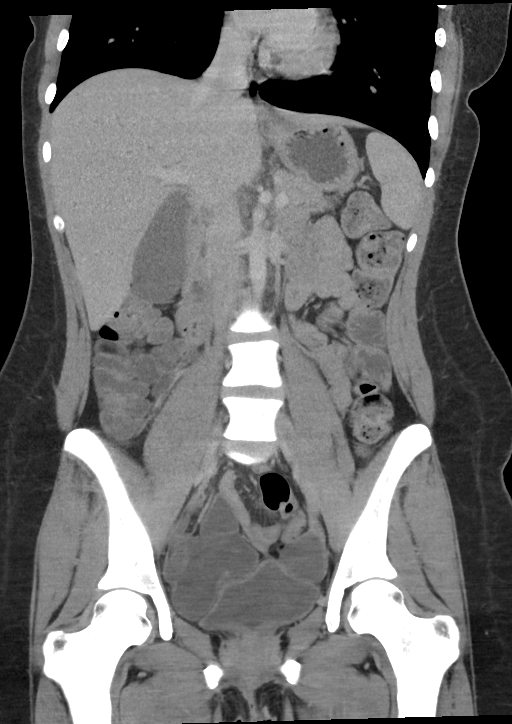
[im 38/69  soft-tissue]
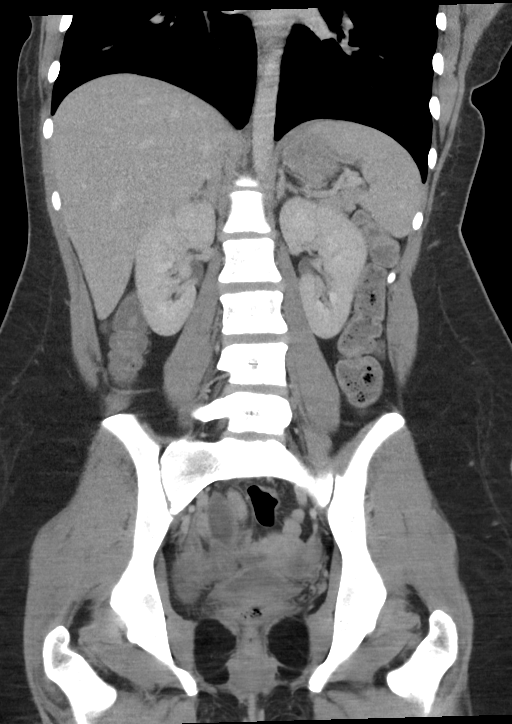

[16 of 46 positions shown; findings below may reference images not displayed]

RADIATION DOSE REDUCTION: This exam was performed according to the
departmental dose-optimization program which includes automated
exposure control, adjustment of the mA and/or kV according to
patient size and/or use of iterative reconstruction technique.

CONTRAST:  75mL OMNIPAQUE IOHEXOL 300 MG/ML  SOLN
FINDINGS: Lower chest: No acute abnormality.

Hepatobiliary: No focal liver abnormality is seen. No gallstones,
gallbladder wall thickening, or biliary dilatation.

Pancreas: Unremarkable. No pancreatic ductal dilatation or
surrounding inflammatory changes.

Spleen: Normal in size without focal abnormality.

Adrenals/Urinary Tract: Adrenal glands are unremarkable. Kidneys are
normal, without renal calculi, focal lesion, or hydronephrosis.
Bladder is unremarkable.

Stomach/Bowel: Stomach is within normal limits. Appendix appears
normal. No evidence of bowel wall thickening, distention, or
inflammatory changes.

Vascular/Lymphatic: No significant vascular findings are present. No
enlarged abdominal or pelvic lymph nodes.

Reproductive: The uterus is unremarkable. A 3.8 cm x 2.9 cm simple
cyst is seen within the left adnexa.

Other: No abdominal wall hernia or abnormality. A trace amount of
posterior pelvic fluid is noted.

Musculoskeletal: No acute or significant osseous findings.
IMPRESSION: 1. 3.8 cm x 2.9 cm simple cyst within the left adnexa. No follow-up
imaging is recommended. Reference: JACR [DATE]):[id number]
2. Trace amount of posterior pelvic fluid, likely physiologic.

## 2024-02-08 IMAGING — US US PELVIS COMPLETE TRANSABD/TRANSVAG W DUPLEX AND/OR DOPPLER
1 series · 13 of 25 positions shown · non-contrast
Comparison: CT with IV contrast yesterday.

CLINICAL DATA: Pelvic pain.

EXAM:
TRANSABDOMINAL AND TRANSVAGINAL ULTRASOUND OF PELVIS
DOPPLER ULTRASOUND OF OVARIES
TECHNIQUE: Both transabdominal and transvaginal ultrasound examinations of the
pelvis were performed. Transabdominal technique was performed for
global imaging of the pelvis including uterus, ovaries, adnexal
regions, and pelvic cul-de-sac.
It was necessary to proceed with endovaginal exam following the
transabdominal exam to visualize the ovaries and adnexal spaces.
Color and duplex Doppler ultrasound was utilized to evaluate blood
flow to the ovaries.

[Series 1: us pelvic complete w transvaginal and torsion righ · 13 of 97 slices shown]
[im 1/97]
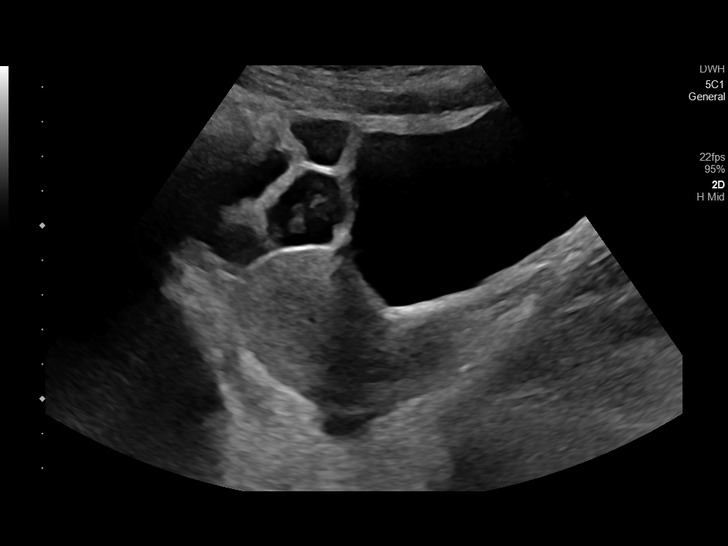
[im 9/97]
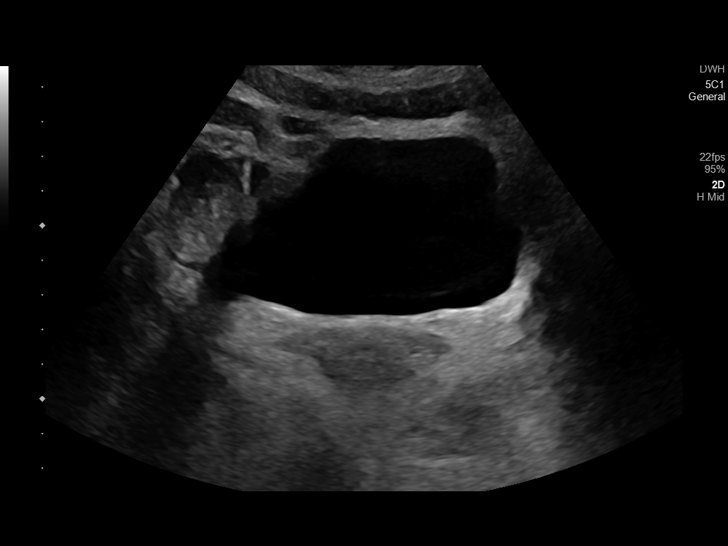
[im 17/97]
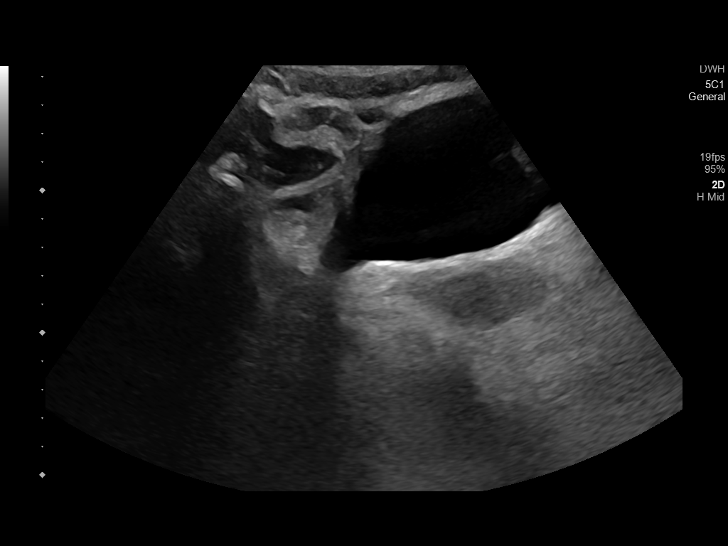
[im 25/97]
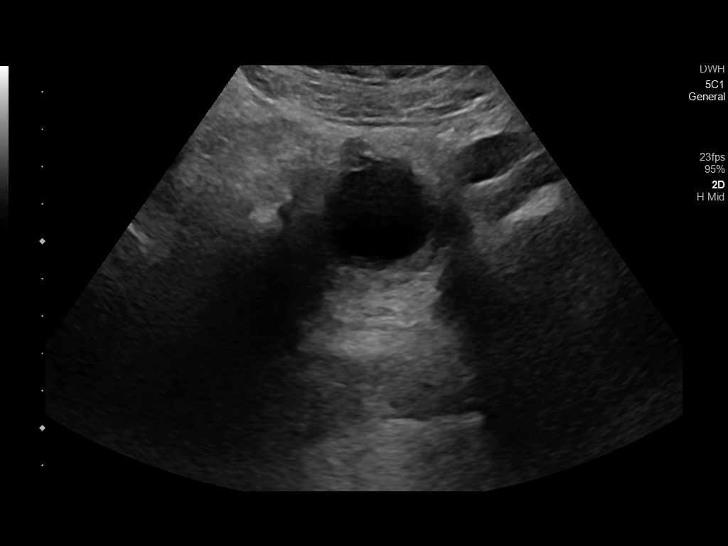
[im 33/97]
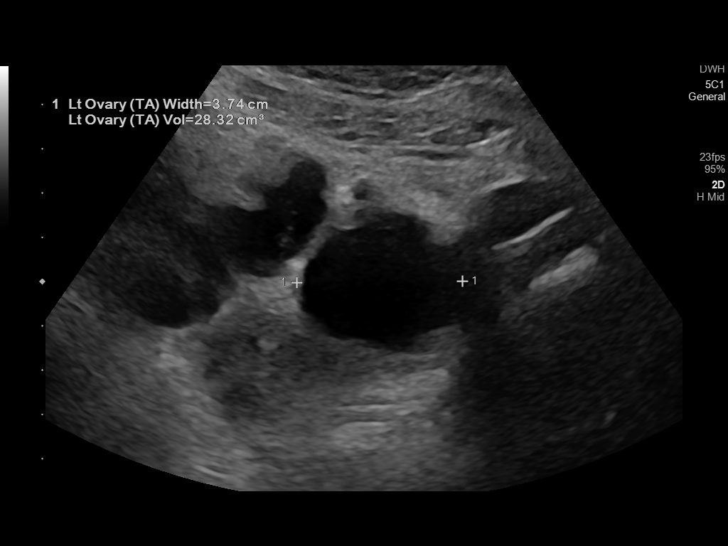
[im 41/97]
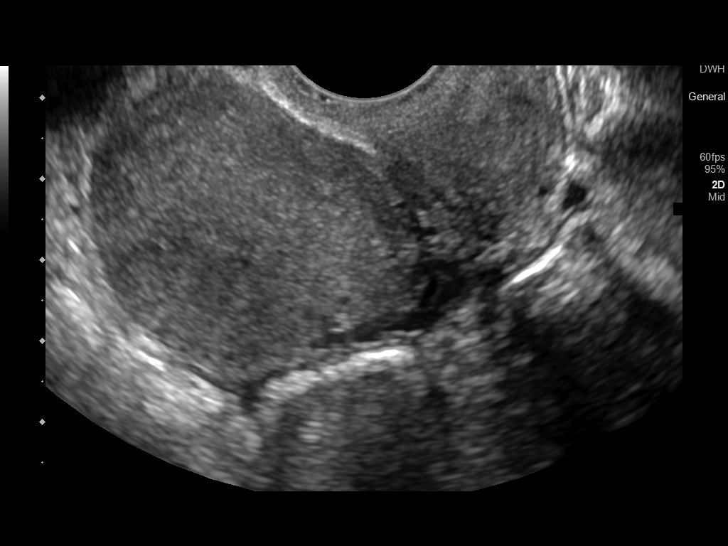
[im 49/97]
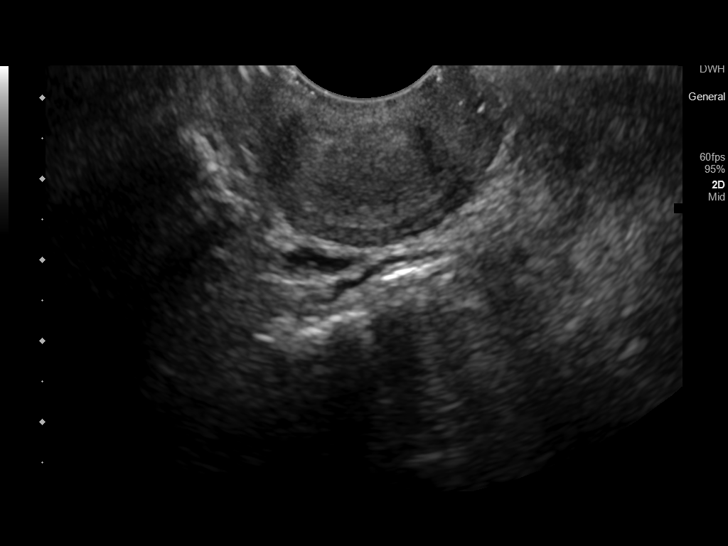
[im 57/97]
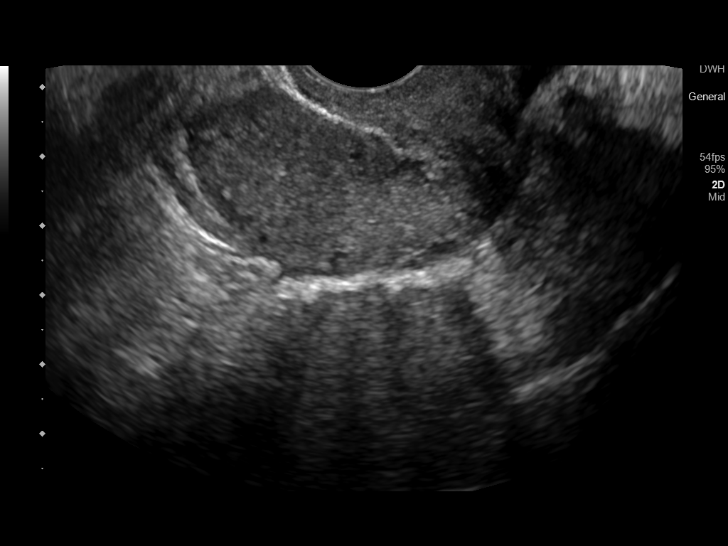
[im 65/97]
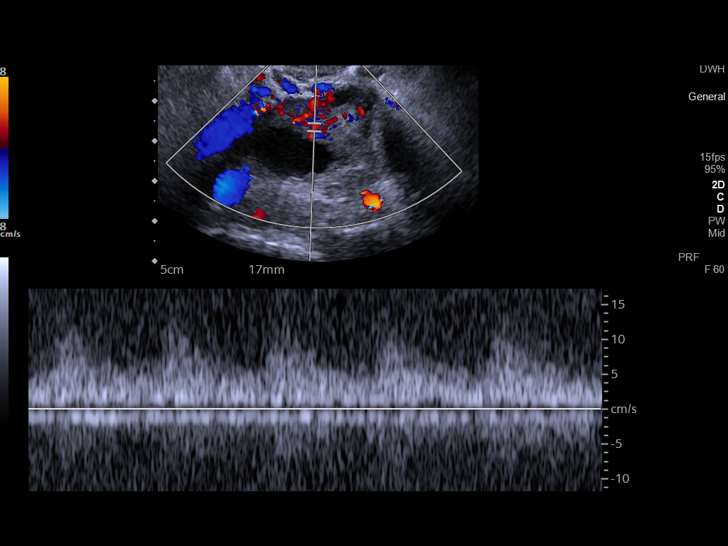
[im 73/97]
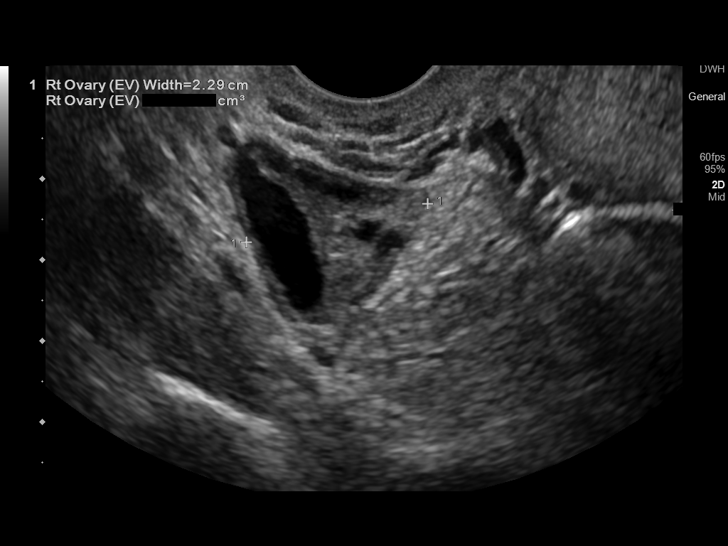
[im 81/97]
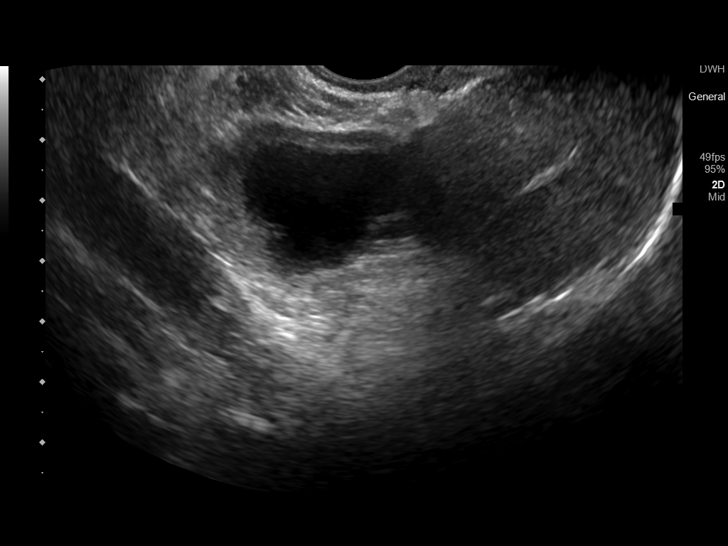
[im 89/97]
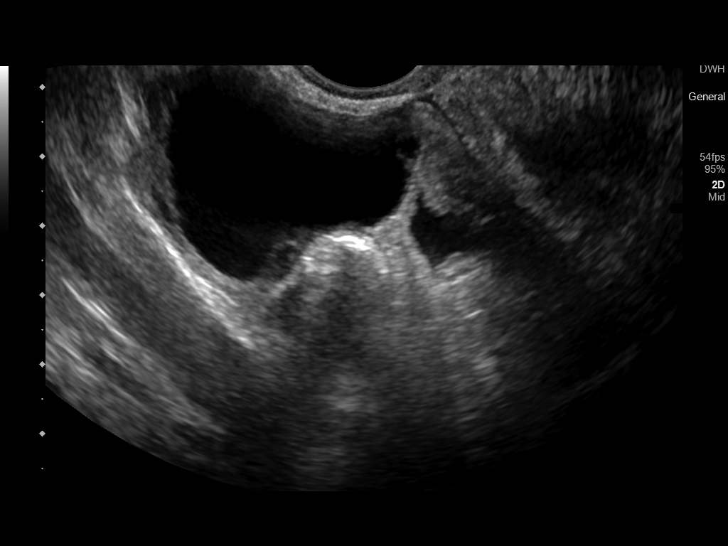
[im 97/97]
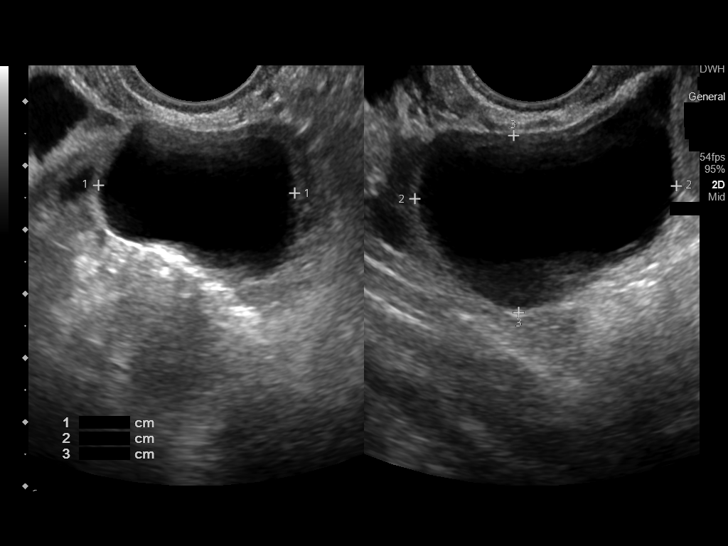

[13 of 25 positions shown; findings below may reference images not displayed]

FINDINGS: Uterus

Measurements: Anteverted measuring 5.9 x 4.1 x 4.6 cm = volume:
mL. No fibroids or other mass visualized. The cervix is closed and
measures 2.8 cm length with trace fluid in the endocervical canal.
No focal cervical mass is suspected.

Endometrium

Thickness: 2.3 mm.  No focal abnormality is seen in the complex.

Right ovary

Measurements: 4.0 x 2.3 x 2.3 cm = volume: 10.8 mL. Normal
appearance/no adnexal mass. Incidentally noted 2.3 cm simple
dominant follicle.

Left ovary

Measurements: 4.4 x 3.7 x 3.6 cm = volume: 30.3 mL. Normal
appearance/no adnexal mass.

Pulsed Doppler evaluation of both ovaries demonstrates no adnexal
mass or suspicious lesion is seen. There is a 4.1 x 2.8 x 3.1 cm
thin walled cyst with layering hypoechoic debris, findings
consistent with a hemorrhagic cyst.

Other findings

There is a small volume of anechoic free fluid in the pelvic
cul-de-sac and left adnexal space.
IMPRESSION: 1. No evidence of ovarian torsion or suspicious mass.
2. 4.1 cm hemorrhagic left ovarian cyst. No imaging follow-up is
recommended. Reference: JACR [DATE] (2): [id number].
3. No uterine mass or endometrial thickening. Trace fluid in the
endocervical canal.
4. Small amount of free fluid.

## 2024-04-01 ENCOUNTER — Ambulatory Visit (LOCAL_COMMUNITY_HEALTH_CENTER): Payer: MEDICAID

## 2024-04-01 VITALS — BP 128/58 | Ht 63.0 in | Wt 136.0 lb

## 2024-04-01 DIAGNOSIS — Z309 Encounter for contraceptive management, unspecified: Secondary | ICD-10-CM

## 2024-04-01 DIAGNOSIS — Z3202 Encounter for pregnancy test, result negative: Secondary | ICD-10-CM | POA: Diagnosis not present

## 2024-04-01 LAB — PREGNANCY, URINE: Preg Test, Ur: POSITIVE — AB

## 2024-04-01 MED ORDER — PRENATAL 27-0.8 MG PO TABS: 1.0000 | ORAL_TABLET | Freq: Every day | ORAL | Status: AC

## 2024-04-01 NOTE — Progress Notes (Signed)
 UPT positive. To clerical for eligibility. Per pt to schedule appt with [facility]. Stated she was on OCP and missed some doses. Stated she taken pregnancy test at home 4 that were positive. Stated her period was supposed to start yest and didn't.  The patient was dispensed PNV today. I provided counseling today regarding the medication. We discussed the medication, the side effects and when to call clinic. Patient given the opportunity to ask questions. Questions answered.

## 2024-04-13 ENCOUNTER — Encounter: Payer: Self-pay | Admitting: Certified Nurse Midwife

## 2024-04-13 DIAGNOSIS — O219 Vomiting of pregnancy, unspecified: Secondary | ICD-10-CM

## 2024-04-18 MED ORDER — PROMETHAZINE HCL 25 MG PO TABS: 25.0000 mg | ORAL_TABLET | Freq: Four times a day (QID) | ORAL | 2 refills | Status: DC | PRN

## 2024-04-25 ENCOUNTER — Telehealth: Payer: MEDICAID

## 2024-04-25 DIAGNOSIS — Z3480 Encounter for supervision of other normal pregnancy, unspecified trimester: Secondary | ICD-10-CM | POA: Insufficient documentation

## 2024-04-25 DIAGNOSIS — Z3689 Encounter for other specified antenatal screening: Secondary | ICD-10-CM

## 2024-04-25 DIAGNOSIS — Z3687 Encounter for antenatal screening for uncertain dates: Secondary | ICD-10-CM

## 2024-04-25 NOTE — Patient Instructions (Addendum)
 First Trimester of Pregnancy  The first trimester of pregnancy starts on the first day of your last monthly period until the end of week 13. This is months 1 through 3 of pregnancy. A week after a sperm fertilizes an egg, the egg will implant into the wall of the uterus and begin to develop into a baby. Body changes during your first trimester Your body goes through many changes during pregnancy. The changes usually return to normal after your baby is born. Physical changes Your breasts may grow larger and may hurt. The area around your nipples may get darker. Your periods will stop. Your hair and nails may grow faster. You may pee more often. Health changes You may tire easily. Your gums may bleed and may be sensitive when you brush and floss. You may not feel hungry. You may have heartburn. You may throw up or feel like you may throw up. You may want to eat some foods, but not others. You may have headaches. You may have trouble pooping (constipation). Other changes Your emotions may change from day to day. You may have more dreams. Follow these instructions at home: Medicines Talk to your health care provider if you're taking medicines. Ask if the medicines are safe to take during pregnancy. Your provider may change the medicines that you take. Do not take any medicines unless told to by your provider. Take a prenatal vitamin that has at least 600 micrograms (mcg) of folic acid. Do not use herbal medicines, illegal substances, or medicines that are not approved by your provider. Eating and drinking While you're pregnant your body needs extra food for your growing baby. Talk with your provider about what to eat while pregnant. Activity Most women are able to exercise during pregnancy. Exercises may need to change as your pregnancy goes on. Talk to your provider about your activities and exercise routines. Relieving pain and discomfort Wear a good, supportive bra if your breasts  hurt. Rest with your legs raised if you have leg cramps or low back pain. Safety Wear your seatbelt at all times when you're in a car. Talk to your provider if someone hits you, hurts you, or yells at you. Talk with your provider if you're feeling sad or have thoughts of hurting yourself. Lifestyle Certain things can be harmful while you're pregnant. Follow these rules: Do not use hot tubs, steam rooms, or saunas. Do not douche. Do not use tampons or scented pads. Do not drink alcohol,smoke, vape, or use products with nicotine or tobacco in them. If you need help quitting, talk with your provider. Avoid cat litter boxes and soil used by cats. These things carry germs that can cause harm to your pregnancy and your baby. General instructions Keep all follow-up visits. It helps you and your unborn baby stay as healthy as possible. Write down your questions. Take them to your visits. Your provider will: Talk with you about your overall health. Give you advice or refer you to specialists who can help with different needs, including: Prenatal education classes. Mental health and counseling. Foods and healthy eating. Ask for help if you need help with food. Call your dentist and ask to be seen. Brush your teeth with a soft toothbrush. Floss gently. Where to find more information American Pregnancy Association: americanpregnancy.org [employer]: acog.org Office on [employer] Health: [employer] Contact a health care provider if: You feel dizzy, faint, or have a fever. You vomit or have watery poop (diarrhea) for 2  days or more. You have abnormal discharge or bleeding from your vagina. You have pain when you pee or your pee smells bad. You have cramps, pain, or pressure in your belly area. Get help right away if: You have trouble breathing or chest pain. You have any kind of injury, such as from a fall or a car crash. These symptoms may be an  emergency. Get help right away. Call 911. Do not wait to see if the symptoms will go away. Do not drive yourself to the hospital. This information is not intended to replace advice given to you by your health care provider. Make sure you discuss any questions you have with your health care provider. Document Revised: 06/11/2023 Document Reviewed: 01/09/2023 Elsevier Patient Education  2024 [employer] Inc.   Common Medications Safe in Pregnancy  Acne:      Constipation:  Benzoyl Peroxide     Colace  Clindamycin      Dulcolax Suppository  Topica Erythromycin     Fibercon  Salicylic Acid      Metamucil         Miralax AVOID:        Senakot   Accutane    Cough:  Retin-A       Cough Drops  Tetracycline      Phenergan w/ Codeine if Rx  Minocycline      Robitussin (Plain & DM)  Antibiotics:     Crabs/Lice:  Ceclor       RID  Cephalosporins    AVOID:  E-Mycins      Kwell  Keflex  Macrobid/Macrodantin   Diarrhea:  Penicillin      Kao-Pectate  Zithromax      Imodium AD         PUSH FLUIDS AVOID:       Cipro     Fever:  Tetracycline      Tylenol (Regular or Extra  Minocycline       Strength)  Levaquin      Extra Strength-Do not          Exceed 8 tabs/24 hrs Caffeine:        200mg /day (equiv. To 1 cup of coffee or  approx. 3 12 oz sodas)         Gas: Cold/Hayfever:       Gas-X  Benadryl      Mylicon  Claritin       Phazyme  **Claritin-D        Chlor-Trimeton    Headaches:  Dimetapp      ASA-Free Excedrin  Drixoral-Non-Drowsy     Cold Compress  Mucinex (Guaifenasin)     Tylenol (Regular or Extra  Sudafed/Sudafed-12 Hour     Strength)  **Sudafed PE Pseudoephedrine   Tylenol Cold & Sinus     Vicks Vapor Rub  Zyrtec  **AVOID if Problems With Blood Pressure         Heartburn: Avoid lying down for at least 1 hour after meals  Aciphex      Maalox     Rash:  Milk of Magnesia     Benadryl    Mylanta       1% Hydrocortisone Cream  Pepcid  Pepcid Complete   Sleep  Aids:  Prevacid      Ambien   Prilosec       Benadryl  Rolaids       Chamomile Tea  Tums (Limit 4/day)     Unisom  Tylenol PM         Warm milk-add vanilla or  Hemorrhoids:       Sugar for taste  Anusol/Anusol H.C.  (RX: Analapram 2.5%)  Sugar Substitutes:  Hydrocortisone OTC     Ok in moderation  Preparation H      Tucks        Vaseline lotion applied to tissue with wiping    Herpes:     Throat:  Acyclovir      Oragel  Famvir  Valtrex     Vaccines:         Flu Shot Leg Cramps:       *Gardasil  Benadryl      Hepatitis A         Hepatitis B Nasal Spray:       Pneumovax  Saline Nasal Spray     Polio Booster         Tetanus Nausea:       Tuberculosis test or PPD  Vitamin B6 25 mg TID   AVOID:    Dramamine      *Gardasil  Emetrol       Live Poliovirus  Ginger Root 250 mg QID    MMR (measles, mumps &  High Complex Carbs @ Bedtime    rebella)  Sea Bands-Accupressure    Varicella (Chickenpox)  Unisom 1/2 tab TID     *No known complications           If received before Pain:         Known pregnancy;   Darvocet       Resume series after  Lortab        Delivery  Percocet    Yeast:   Tramadol      Femstat  Tylenol 3      Gyne-lotrimin  Ultram       Monistat  Vicodin           MISC:         All Sunscreens           Hair Coloring/highlights          Insect Repellant's          (Including DEET)         Mystic Tans   Commonly Asked Questions During Pregnancy   Cats: A parasite can be excreted in cat feces.  To avoid exposure you need to have another person empty the little box.  If you must empty the litter box you will need to wear gloves.  Wash your hands after handling your cat.  This parasite can also be found in raw or undercooked meat so this should also be avoided.  Colds, Sore Throats, Flu: Please check your medication sheet to see what you can take for symptoms.  If your symptoms are unrelieved by these medications please call the office.  Dental Work: Most  any dental work Agricultural consultant recommends is permitted.  X-rays should only be taken during the first trimester if absolutely necessary.  Your abdomen should be shielded with a lead apron during all x-rays.  Please notify your provider prior to receiving any x-rays.  Novocaine is fine; gas is not recommended.  If your dentist requires a note from [place] prior to dental work please call the office and we will provide one for you.  Exercise: Exercise is an important part of staying healthy during your pregnancy.  You may continue most exercises you were accustomed to prior to pregnancy.  Later in your pregnancy you will most likely notice you have difficulty with activities requiring balance like riding a bicycle.  It is important that you listen to your body and avoid activities that put you at a higher risk of falling.  Adequate rest and staying well hydrated are a must!  If you have questions about the safety of specific activities ask your provider.    Exposure to Children with illness: Try to avoid obvious exposure; report any symptoms to [place] when noted,  If you have chicken pos, red measles or mumps, you should be immune to these diseases.   Please do not take any vaccines while pregnant unless you have checked with your OB provider.  Fetal Movement: After 28 weeks we recommend you do "kick counts" twice daily.  Lie or sit down in a calm quiet environment and count your baby movements "kicks".  You should feel your baby at least 10 times per hour.  If you have not felt 10 kicks within the first hour get up, walk around and have something sweet to eat or drink then repeat for an additional hour.  If count remains less than 10 per hour notify your provider.  Fumigating: Follow your pest control agent's advice as to how long to stay out of your home.  Ventilate the area well before re-entering.  Hemorrhoids:   Most over-the-counter preparations can be used during pregnancy.  Check your medication to see what is  safe to use.  It is important to use a stool softener or fiber in your diet and to drink lots of liquids.  If hemorrhoids seem to be getting worse please call the office.   Hot Tubs:  Hot tubs Jacuzzis and saunas are not recommended while pregnant.  These increase your internal body temperature and should be avoided.  Intercourse:  Sexual intercourse is safe during pregnancy as long as you are comfortable, unless otherwise advised by your provider.  Spotting may occur after intercourse; report any bright red bleeding that is heavier than spotting.  Labor:  If you know that you are in labor, please go to the hospital.  If you are unsure, please call the office and let us help you decide what to do.  Lifting, straining, etc:  If your job requires heavy lifting or straining please check with your provider for any limitations.  Generally, you should not lift items heavier than that you can lift simply with your hands and arms (no back muscles)  Painting:  Paint fumes do not harm your pregnancy, but may make you ill and should be avoided if possible.  Latex or water based paints have less odor than oils.  Use adequate ventilation while painting.  Permanents & Hair Color:  Chemicals in hair dyes are not recommended as they cause increase hair dryness which can increase hair loss during pregnancy.  " Highlighting" and permanents are allowed.  Dye may be absorbed differently and permanents may not hold as well during pregnancy.  Sunbathing:  Use a sunscreen, as skin burns easily during pregnancy.  Drink plenty of fluids; avoid over heating.  Tanning Beds:  Because their possible side effects are still unknown, tanning beds are not recommended.  Ultrasound Scans:  Routine ultrasounds are performed at approximately 20 weeks.  You will be able to see your baby's general anatomy an if you would like to know the gender this can usually be determined as well.  If it is questionable when you conceived you may  also  receive an ultrasound early in your pregnancy for dating purposes.  Otherwise ultrasound exams are not routinely performed unless there is a medical necessity.  Although you can request a scan we ask that you pay for it when conducted because insurance does not cover " patient request" scans.  Work: If your pregnancy proceeds without complications you may work until your due date, unless your physician or employer advises otherwise.  Round Ligament Pain/Pelvic Discomfort:  Sharp, shooting pains not associated with bleeding are fairly common, usually occurring in the second trimester of pregnancy.  They tend to be worse when standing up or when you remain standing for long periods of time.  These are the result of pressure of certain pelvic ligaments called "round ligaments".  Rest, Tylenol and heat seem to be the most effective relief.  As the womb and fetus grow, they rise out of the pelvis and the discomfort improves.  Please notify the office if your pain seems different than that described.  It may represent a more serious condition.

## 2024-04-25 NOTE — Progress Notes (Signed)
 New OB Intake  I connected with  Courtney [name] on 04/25/24 at 11:15 AM EDT by MyChart Video Visit and verified that I am speaking with the correct person using two identifiers. Nurse is located at [facility] and pt is located at home.  I discussed the limitations, risks, security and privacy concerns of performing an evaluation and management service by telephone and the availability of in person appointments. I also discussed with the patient that there may be a patient responsible charge related to this service. The patient expressed understanding and agreed to proceed.  I explained I am completing New OB Intake today. We discussed her EDD of 12/10/24 that is based on LMP of 03/05/24. Pt is G1/P0. I reviewed her allergies, medications, Medical/Surgical/OB history, and appropriate screenings. There are cats in the home: no. Based on history, this is a/an pregnancy uncomplicated . Her obstetrical history is significant for N/A.  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  Anatomy US  Explained first scheduled US  will be 05/16/24. Anatomy US  will be scheduled around [redacted] weeks gestational age.  Labs Discussed genetic screening with patient. Patient desires genetic testing to be drawn at new OB visit. Discussed possible labs to be drawn at new OB appointment.  COVID Vaccine Patient has not had COVID vaccine.   Social Determinants of Health Food Insecurity: denies food insecurity  Transportation: Patient denies transportation needs. Childcare: Discussed no children allowed at ultrasound appointments.   First visit review I reviewed new OB appt with pt. I explained she will have blood work and pap smear/pelvic exam if indicated. Explained pt will be seen by Slater Rains, CNM at first visit; encounter routed to appropriate provider.   Beola Skeens, CMA 04/25/2024  11:33 AM

## 2024-05-16 ENCOUNTER — Ambulatory Visit: Payer: MEDICAID

## 2024-05-16 DIAGNOSIS — Z3687 Encounter for antenatal screening for uncertain dates: Secondary | ICD-10-CM | POA: Diagnosis not present

## 2024-05-16 DIAGNOSIS — Z3A10 10 weeks gestation of pregnancy: Secondary | ICD-10-CM | POA: Diagnosis not present

## 2024-05-30 ENCOUNTER — Other Ambulatory Visit (HOSPITAL_COMMUNITY)
Admission: RE | Admit: 2024-05-30 | Discharge: 2024-05-30 | Disposition: A | Discharge status: Home / Self Care | Payer: MEDICAID | Source: Ambulatory Visit | Attending: Advanced Practice Midwife | Admitting: Advanced Practice Midwife

## 2024-05-30 ENCOUNTER — Ambulatory Visit (INDEPENDENT_AMBULATORY_CARE_PROVIDER_SITE_OTHER): Payer: MEDICAID | Admitting: Advanced Practice Midwife

## 2024-05-30 ENCOUNTER — Encounter: Payer: Self-pay | Admitting: Advanced Practice Midwife

## 2024-05-30 VITALS — BP 120/72 | HR 61 | Wt 143.6 lb

## 2024-05-30 DIAGNOSIS — Z113 Encounter for screening for infections with a predominantly sexual mode of transmission: Secondary | ICD-10-CM | POA: Insufficient documentation

## 2024-05-30 DIAGNOSIS — Z0184 Encounter for antibody response examination: Secondary | ICD-10-CM

## 2024-05-30 DIAGNOSIS — Z130 Encounter for screening for diseases of the blood and blood-forming organs and certain disorders involving the immune mechanism: Secondary | ICD-10-CM

## 2024-05-30 DIAGNOSIS — Z3A12 12 weeks gestation of pregnancy: Secondary | ICD-10-CM | POA: Diagnosis not present

## 2024-05-30 DIAGNOSIS — Z3401 Encounter for supervision of normal first pregnancy, first trimester: Secondary | ICD-10-CM | POA: Diagnosis not present

## 2024-05-30 DIAGNOSIS — Z1379 Encounter for other screening for genetic and chromosomal anomalies: Secondary | ICD-10-CM

## 2024-05-30 DIAGNOSIS — Z0283 Encounter for blood-alcohol and blood-drug test: Secondary | ICD-10-CM

## 2024-05-30 DIAGNOSIS — Z3689 Encounter for other specified antenatal screening: Secondary | ICD-10-CM

## 2024-05-30 NOTE — Progress Notes (Signed)
 OBSTETRIC INITIAL PRENATAL VISIT   Chief Complaint: Desires prenatal care   History of Present Illness: Patient is a 18 y.o. G1P0000 Not Hispanic or Latino female, presents with amenorrhea and positive home pregnancy test. Patient's last menstrual period was 03/05/2024 (exact date). and based on her  LMP, her EDD is Estimated Date of Delivery: 12/10/24 and her EGA is [redacted]w[redacted]d. Cycles are 7-10 days, regular, and occur approximately every : 28 days.   She had a urine pregnancy test which was positive 2 month(s)  ago. Her last menstrual period was normal and lasted for  7 or 8 day(s). Since her LMP she claims she has experienced fatigue, nausea. She denies vaginal bleeding. Her past medical history is noncontributory.   Since LMP she admits to the use of tobacco products: quit vaping with +UPT She claims she has gained 8 pounds since the start of her pregnancy.  There are cats in the home in the home  no She admits close contact with children on a regular basis  no  She has had chicken pox in the past no She has had Tuberculosis exposures, symptoms, or previously tested positive for TB   no Current or past history of domestic violence. no  Genetic Screening/Teratology Counseling: (Includes patient, baby's father, or anyone in either family with:)   1. Patient's age >/= 28 at [facility]  no 2. Thalassemia ([place], [place], Mediterranean, or Asian background): MCV<80  no 3. Neural tube defect (meningomyelocele, spina bifida, anencephaly)  no 4. Congenital heart defect  no  5. Down syndrome  no 6. Tay-Sachs (Jewish, [place]  no 7. Canavan's Disease  no 8. Sickle cell disease or trait (African)  no  9. Hemophilia or other blood disorders  no  10. Muscular dystrophy  no  11. Cystic fibrosis  no  12. Huntington's Chorea  no  13. Mental retardation/autism  no 14. Other inherited genetic or chromosomal disorder  no 15. Maternal metabolic disorder (DM, PKU, etc)  no 16. Patient or FOB with  a child with a birth defect not listed above no  16a. Patient or FOB with a birth defect themselves no 17. Recurrent pregnancy loss, or stillbirth  no  18. Any medications since LMP other than prenatal vitamins (include vitamins, supplements, OTC meds, drugs, alcohol)  no 19. Any other genetic/environmental exposure to discuss  no  Infection History:   1. Lives with someone with TB or TB exposed  no  2. Patient or partner has history of genital herpes  no 3. Rash or viral illness since LMP  no 4. History of STI (GC, CT, HPV, syphilis, HIV)  no 5. History of recent travel :  no  Other pertinent information:  no    Review of Systems:10 point review of systems negative unless otherwise noted in HPI  Past Medical History:  Patient Active Problem List   Diagnosis Date Noted   Supervision of other normal pregnancy, antepartum 04/25/2024     Clinical Staff Provider  Office Location  [facility] Dating  Not found.  Language  English Anatomy US     Flu Vaccine  Offer Genetic Screen  NIPS:   TDaP vaccine   Offer Hgb A1C or  GTT Early : Third trimester :   Covid Declined   LAB RESULTS   Rhogam     Blood Type     RSV Offer Antibody    Feeding Plan Breastfeed Rubella    Contraception BC Pills RPR     Circumcision Yes  HBsAg     Pediatrician  Undecided HIV    Support Person FOB & Best Friend Varicella    Prenatal Classes  GBS  (For PCN allergy, check sensitivities)     Hep C     BTL Consent  Pap No results found for: DIAGPAP  VBAC Consent  Hgb Electro      CF      SMA             Nicotine  vapor product user 01/01/2024    Past Surgical History:  Past Surgical History:  Procedure Laterality Date   LAPAROSCOPIC APPENDECTOMY N/A 09/15/2023   Procedure: APPENDECTOMY LAPAROSCOPIC;  Surgeon: Marinda Jayson KIDD, MD;  Location: [facility] ORS;  Service: General;  Laterality: N/A;   TONSILLECTOMY      Gynecologic History: Patient's last menstrual period was 03/05/2024 (exact  date).  Obstetric History: G1P0000  Family History:  Family History  Problem Relation Age of Onset   Hypertension Mother    Asthma Mother    Diabetes Maternal Grandmother    Diabetes Paternal Grandmother     Social History:  Social History   Socioeconomic History   Marital status: Significant Other    Spouse name: Not on file   Number of children: Not on file   Years of education: Not on file   Highest education level: GED or equivalent  Occupational History   Not on file  Tobacco Use   Smoking status: Never   Smokeless tobacco: Never  Vaping Use   Vaping status: Former  Substance and Sexual Activity   Alcohol use: No   Drug use: Not Currently    Types: Marijuana   Sexual activity: Yes    Partners: Male    Birth control/protection: None  Other Topics Concern   Not on file  Social History Narrative   Not on file   Social Drivers of Health   Financial Resource Strain: Low Risk  (04/22/2024)   Overall Financial Resource Strain (CARDIA)    Difficulty of Paying Living Expenses: Not hard at all  Food Insecurity: No Food Insecurity (04/22/2024)   Hunger Vital Sign    Worried About Running Out of Food in the Last Year: Never true    Ran Out of Food in the Last Year: Never true  Transportation Needs: No Transportation Needs (04/22/2024)   PRAPARE - Administrator, Civil Service (Medical): No    Lack of Transportation (Non-Medical): No  Physical Activity: Sufficiently Active (04/22/2024)   Exercise Vital Sign    Days of Exercise per Week: 7 days    Minutes of Exercise per Session: 30 min  Stress: No Stress Concern Present (04/22/2024)   [employer] of Occupational Health - Occupational Stress Questionnaire    Feeling of Stress: Not at all  Social Connections: Unknown (04/25/2024)   Social Connection and Isolation Panel    Frequency of Communication with Friends and Family: More than three times a week    Frequency of Social Gatherings with Friends and  Family: More than three times a week    Attends Religious Services: Never    Database administrator or Organizations: No    Attends Banker Meetings: Never    Marital Status: Not on file  Intimate Partner Violence: Not At Risk (04/25/2024)   Humiliation, Afraid, Rape, and Kick questionnaire    Fear of Current or Ex-Partner: No    Emotionally Abused: No    Physically Abused: No    Sexually Abused:  No    Allergies:  Allergies  Allergen Reactions   Oxycodone  Hives   Sulfa Antibiotics Hives    Medications: Prior to Admission medications   Medication Sig Start Date End Date Taking? Authorizing Provider  Prenatal Vit-Fe Fumarate-FA (MULTIVITAMIN-PRENATAL) 27-0.8 MG TABS tablet Take 1 tablet by mouth daily at 12 noon. 04/01/24 07/10/24 Yes Macario Dorothyann HERO, MD    Physical Exam Vitals: Blood pressure 120/72, pulse 61, weight 143 lb 9.6 oz (65.1 kg), last menstrual period 03/05/2024.  General: NAD HEENT: normocephalic, anicteric Thyroid: no enlargement, no palpable nodules Pulmonary: No increased work of breathing, CTAB Cardiovascular: RRR, distal pulses 2+ Abdomen: NABS, soft, non-tender, non-distended.  Umbilicus without lesions.   No evidence of hernia. FHTs by doppler 140's/150's  Genitourinary:  External: Normal external female genitalia.  Normal urethral meatus, normal Bartholin's and Skene's glands.    Vagina: Normal vaginal mucosa, no evidence of prolapse.   Extremities: no edema, erythema, or tenderness Neurologic: Grossly intact Psychiatric: mood appropriate, affect full   The following were addressed during this visit:  Breastfeeding Education - Early initiation of breastfeeding    Comments: Keeps milk supply adequate, helps contract uterus and slow bleeding, and early milk is the perfect first food and is easy to digest.   - The importance of exclusive breastfeeding    Comments: Provides antibodies, Lower risk of breast and ovarian cancers, and type-2  diabetes,Helps your body recover, Reduced chance of SIDS.   - Risks of giving your baby anything other than breast milk if you are breastfeeding    Comments: Make the baby less content with breastfeeds, may make my baby more susceptible to illness, and may reduce my milk supply.   - The importance of early skin-to-skin contact    Comments:  Keeps baby warm and secure, helps keep baby's blood sugar up and breathing steady, easier to bond and breastfeed, and helps calm baby.  - Rooming-in on a 24-hour basis    Comments: Easier to learn baby's feeding cues, easier to bond and get to know each other, and encourages milk production.   - Feeding on demand or baby-led feeding    Comments: Helps prevent breastfeeding complications, helps bring in good milk supply, prevents under or overfeeding, and helps baby feel content and satisfied   - Frequent feeding to help assure optimal milk production    Comments: Making a full supply of milk requires frequent removal of milk from breasts, infant will eat 8-12 times in 24 hours, if separated from infant use breast massage, hand expression and/ or pumping to remove milk from breasts.   - Effective positioning and attachment    Comments: Helps my baby to get enough breast milk, helps to produce an adequate milk supply, and helps prevent nipple pain and damage   - Exclusive breastfeeding for the first 6 months    Comments: Builds a healthy milk supply and keeps it up, protects baby from sickness and disease, and breastmilk has everything your baby needs for the first 6 months.    Assessment: 18 y.o. G1P0000 at [redacted]w[redacted]d presenting to initiate prenatal care  Plan: 1) Avoid alcoholic beverages. 2) Patient encouraged not to smoke.  3) Discontinue the use of all non-medicinal drugs and chemicals.  4) Take prenatal vitamins daily.  5) Nutrition, food safety (fish, cheese advisories, and high nitrite foods) and exercise discussed. 6) Hospital and  practice style discussed with cross coverage system.  7) Genetic Screening, such as with 1st Trimester Screening, cell free fetal  DNA, AFP testing, and Ultrasound, as well as with amniocentesis and CVS as appropriate, is discussed with patient. At the conclusion of today's visit patient requested genetic testing 8) Patient is asked about travel to areas at risk for the Zika virus, and counseled to avoid travel and exposure to mosquitoes or sexual partners who may have themselves been exposed to the virus. Testing is discussed, and will be ordered as appropriate.  9) NOB labs today 10) Return to clinic in 4 weeks for ROB and in 8 weeks for anatomy us /ROB   Slater Rains, CNM [facility] 05/30/2024 12:02 PM

## 2024-05-30 NOTE — Patient Instructions (Signed)
 Prenatal Care Prenatal care is health care you get when pregnant. It helps you and your unborn baby stay as healthy as possible. Start prenatal care early in your pregnancy and continue to go to visits during your pregnancy. Prenatal care may be given by a midwife, a family practice doctor, a Publishing rights manager, physician assistant, or a childbirth and pregnancy doctor. What are the benefits of prenatal care? In prenatal care, your health care provider will get to know your medical history. You'll be checked for conditions that might affect you and your baby. Prenatal care will: Lower the risk for problems as your child grows. Lower certain risks for your baby, especially the risk that: Your child may be born early. Your child will have a low weight at birth. What can I expect at the first prenatal care visit? Your first visit will likely be the longest. You should ask to be seen as soon as you know you're pregnant. The first visit is a good time to talk about any questions or concerns. Make a list of questions to ask your provider at your visits. Medical history At your visit, you and your provider will talk about your medical history, including: Your family's medical history and the medical history of the baby's father. Any past pregnancies and long-term (chronic) health conditions. Any surgeries or procedures you have had. All medicines you're taking. Tell them if you're taking herbs or supplements too. Any tobacco, alcohol, or drug use. Other problems that may harm you and your baby. Tell them if: You need food or housing. You have been around chemicals or radiation. Your partner yells at you, hits you, or hurts you. Tests and screenings Your provider will: Do a physical exam, including a pelvic and breast exam. Do tests to check for: Urinary tract infection (UTI). Sexually transmitted infections (STIs). Low iron levels in your blood. This is called anemia. Blood type and certain  proteins on red blood cells called Rh antibodies. Infections and immunity to viruses, such as hepatitis B and rubella. HIV. Ask your provider if you need to be checked for genetic diseases. Tips about staying healthy Your provider will also give you information about how to keep yourself and your baby healthy, including: Nutrition, vitamins, and food safety. Physical activity. How to treat some problems, such as morning sickness. How to avoid infections and substances that may harm your baby. Caring for your teeth. Work and travel. Problems that require you to call your provider. How often will I have prenatal care visits? After your first prenatal care visit, you will have regular visits throughout your pregnancy. You may visit your provider as follows: Up to week 28 of pregnancy: once every 4 weeks. 28-36 weeks: once every 2 weeks. After 36 weeks: every week until delivery. Some people may have more visits. Others may have fewer. It all depends on your health and that of your baby. Keep all prenatal visits. This is one way for you and your baby to stay as healthy as possible. What happens during routine prenatal care visits? Your provider will: Check your weight and blood pressure. Check your baby's heart sounds. Ask questions about your diet, exercise, sleeping patterns, and whether you can feel the baby move. Ask about any pregnancy symptoms you're having and how you're dealing with them. Tell your provider if: You throw up or feel like you may throw up. You have discharge or you bleed from your vagina. You have trouble pooping (constipation). You have swelling, headaches, or trouble  seeing. You are very tired, or you feel sad and anxious all the time. You have discomfort, including back pain or pain in the pelvis. Tell you problems to watch for during your pregnancy, including signs of labor. Measure the height of your uterus in your belly. This is called fundal height. What  tests might I have during prenatal care visits? You may have blood, urine, and imaging tests. These may include: Urine tests to check for blood sugar, protein, or signs of infection. Genetic testing. Ultrasounds to check your baby's growth, development, and well-being. Your baby may also be checked for congenital conditions. Glucose tests to check for gestational diabetes. This is a form of diabetes that a person can get when pregnant. A test to check for group B strep (GBS) infection. What else can I expect during prenatal care visits? Your provider may give you some vaccines. Getting certain vaccines during pregnancy can protect your baby after birth. These may include: A flu shot. Tdap (tetanus, diphtheria, pertussis) vaccine. A COVID-19 vaccine. A RSV vaccine. Later in your pregnancy, your provider may talk to you about: Childbirth and childbirth classes. Breastfeeding and breastfeeding classes. Birth control after your baby is born. Where to find more information Office on Women's Health: TravelLesson.ca American Pregnancy Association: americanpregnancy.org March of Dimes: marchofdimes.org This information is not intended to replace advice given to you by your health care provider. Make sure you discuss any questions you have with your health care provider. Document Revised: 01/12/2023 Document Reviewed: 01/12/2023 Elsevier Patient Education  2024 Elsevier Inc.Pregnancy: Healthy Eating While you're pregnant, your body needs extra nutrition for your growing baby. You also need more vitamins and minerals, such as folic acid, calcium, iron, and vitamin D. Eating a balanced diet is important for both you and your baby. Your need for extra calories will change during pregnancy. During the first 3 months of pregnancy, called the first trimester, you don't need more calories. During the second trimester, you'll need about 340 extra calories a day. During the third trimester, you'll need  about 450 extra calories a day. If you're carrying more than one baby, talk with your health care provider or a dietitian to learn more about your specific eating needs. What are tips for eating healthy during pregnancy? Meal planning  Eating smaller meals throughout the day may help manage some side effects common in pregnancy, like heartburn and reflux. Eat a variety of foods. Be sure to include many types of fruits and vegetables. Two or more servings of fish are recommended each week. Choose fish that are lower in mercury, such as salmon and pollock. Limit foods that have empty calories. These are foods that have little nutritional value, such as sweets, desserts, candies, and drinks with sugar in them. Drinks that have caffeine are OK to drink, but it's better to avoid caffeine. Limit your total caffeine intake to less than 200 mg each day, or the limit you're told by your provider. Be aware that 200 mg of caffeine is 12 oz or 355 mL of coffee, tea, or soda. General information Take a prenatal vitamin to help meet your vitamin and mineral needs during pregnancy. This includes your need for folic acid, iron, calcium, and vitamin D. Do not try to lose weight or go on a diet during pregnancy. Food safety  Wash your hands before you eat and after you prepare raw meat. Wash all fruits and vegetables well before peeling or eating. Make sure that all meats, poultry, and eggs are  cooked to food-safe temperatures or well-done. Taking these actions can help keep your food safe and protect you and your baby from dangerous food illnesses. Ask your provider for more information. What foods should I eat? Fruits All fruits. Eat a variety of colors and types of fruit. Remember to wash your fruits well before peeling or eating. Vegetables All vegetables. Eat a variety of colors and types of vegetables. Remember to wash your vegetables well before peeling or eating. Grains All grains. Choose whole  grains, such as whole-wheat bread, oatmeal, or brown rice. Meats and other protein foods Lean meats, including chicken, [place], and lean cuts of beef, veal, or pork. Fish that is higher in omega-3 fatty acids and lower in mercury, such as salmon, herring, mussels, trout, sardines, pollock, shrimp, crab, and lobster. Tofu. Tempeh. Beans. Eggs. Peanut butter and other nut butters. Dairy Pasteurized milk and milk alternatives, such as soy milk. Pasteurized yogurt and pasteurized cheese. Cottage cheese. Sour cream. Beverages Water. Juices that contain 100% fruit juice or vegetable juice. Caffeine-free teas and decaffeinated coffee. Fats and oils Fats and oils are OK to include in moderation. Sweets and desserts Sweets and desserts are OK to include in moderation. Seasoning and other foods All pasteurized condiments. The items listed above may not be all the foods and drinks you can have. Talk with a dietitian to learn more. What does 340 extra calories look like? Healthy snacks that give you 340 more calories a day could be: Peanut butter and jelly with milk: 8 oz (237 mL) of low-fat milk. Peanut butter and jelly sandwich made with: 1 slice of whole-wheat bread. 2 teaspoons (10 g) of peanut butter. Yogurt and berries: 1 cup (245 g) of [place] yogurt. 1 cup (150 g) of berries. 2 tablespoons (30 g) of chopped nuts, such as almonds or walnuts. Avocado toast: 1 slice of whole-wheat bread. 1/2 medium avocado (70 g). 1 large egg (50 g). What foods should I avoid? Fruits Raw (unpasteurized) fruit juices. Vegetables Unpasteurized vegetable juices. Meats and other protein foods Precooked or cured meat, such as bologna, hot dogs, sausages, or meat loaves. (If you must eat those meats, reheat them until they are steaming hot.) Refrigerated pate, meat spreads from a meat counter, or smoked seafood that's found in the refrigerated section of a store. Raw or undercooked meats, poultry, and  eggs. Raw fish, such as sushi or sashimi. Fish that have high mercury content, such as tilefish, shark, swordfish, and king mackerel. Dairy Unpasteurized or raw milk and any foods that are made from them. Some of these may be: Homemade yogurts or puddings. Soft cheeses such as: Feta. Queso blanco or fresco. Pharmacist, hospital or Standard. Blue-veined cheeses. Some of these types of cheeses may be made with pasteurized milk. Check the label. If pasteurized milk is used, they are OK to eat during pregnancy. Deli foods Premade foods from a store or deli, like chicken salad, coleslaw, or egg salad. These are riskier for food illness than fresh or homemade salads. Beverages Alcohol. Sugar-sweetened drinks, such as sodas or teas. Energy drinks. Seasoning and other foods Homemade fermented foods and drinks, such as: Pickles. Sauerkraut. Kombucha. Store-bought pasteurized versions of these are OK. The items listed above may not be all the foods and drinks you should avoid. Talk with a dietitian to learn more. Where to find more information To learn more, go to: Centers for Disease Control and Prevention at TonerPromos.no. Click Search and type food choices for pregnancy. Find the link you need.  MyPlate at http://pittman-dennis.biz/. This information is not intended to replace advice given to you by your health care provider. Make sure you discuss any questions you have with your health care provider. Document Revised: 08/26/2023 Document Reviewed: 08/26/2023 Elsevier Patient Education  2025 Elsevier Inc.Exercise During Pregnancy Exercise is an important part of being healthy for people of all ages. Exercise helps your heart and lungs work well. Exercise also: Helps you stay strong and flexible. Helps you keep a healthy body weight. Boosts your energy levels and improves your mood. You should try to exercise regularly during pregnancy. Exercise routines may need to change later in your pregnancy. In rare  cases, certain medical problems in your pregnancy may limit the exercise you can do during pregnancy. Your health care provider will give you information on what exercises will work for you. How does exercise help during pregnancy? Along with staying strong and flexible, exercising during pregnancy can help: Keep strength in muscles that are used during labor and birth. Control weight gain. Speed up your recovery after giving birth. Reduce the need for insulin if you get diabetes during pregnancy. Decrease low back pain. Lower the risk for depression. Lower the risk of cesarean delivery. Treat trouble pooping (constipation). How does exercise affect my baby? Exercise can help you have a healthy pregnancy. Exercise does not cause your baby to be born early. It will not cause your baby to weigh less at birth. What exercises can I do? Many exercises are safe for you to do during pregnancy. Do a variety of exercises that safely increase your heart and breathing rates and help you build and maintain muscle strength. Do exercises as told by your provider. Your provider may recommend: Walking. Swimming. Water aerobics. Riding a stationary bike. Modified yoga or Pilates. Tell your instructor that you're pregnant. Avoid overstretching. Avoid lying on your back for long periods of time. Resistance exercises with weights or elastic bands. Running or jogging. Choose this type of exercise only if: You ran or jogged regularly before your pregnancy. You can run or jog and still talk in full sentences. What exercises should I avoid? You may be told to limit high-intensity exercise depending on your level of fitness and if you exercised regularly before you became pregnant. You can tell that you're exercising at a high intensity if you're breathing much harder and faster and can't hold a conversation while exercising. You may be told to: Avoid jogging or running, unless you jogged or ran regularly before  you became pregnant. Do not run or jog so fast that you're unable to have a conversation. Avoid activities that put you at risk for falling on your belly or getting hit in the belly. Some of these are: Downhill skiing. Rock climbing. Cycling and gymnastics. Horseback riding. Surfing and waterskiing. Contact sports. Avoid scuba diving. Avoid skydiving. Avoid activities that take place in a room that's heated to high temperatures, such as hot yoga or hot Pilates. How do I exercise in a safe way?  Start slowly. Ask your provider to recommend the types of exercise that are safe for you. Avoid overheating. Do not exercise in very high temperatures or hot rooms. Avoid hot yoga or hot Pilates. Avoid standing still or lying flat on your back as much as you can. Avoid losing too much fluid (dehydration). Drink more fluids as told. Drink before, during, and after you exercise. Avoid overstretching. Because of hormone changes during pregnancy, it's easy to overstretch muscles, tendons, and ligaments. Ligaments are the  tissues that connect bones to each other. Do not exercise to lose weight. Do not exercise at more than 6,000 feet above sea level (high elevation) if you don't live at that elevation. Tips and recommendations Wear loose-fitting, breathable clothes. Wear a sports bra to support your breasts. Exercise on most days or all days of the week. Try to exercise for 30 minutes a day, 5 days a week. If problems come up during your pregnancy, you provider may tell you to limit some exercises or to exercise less. If you have concerns, ask your provider. If you actively exercised before your pregnancy, your provider may tell you to continue to do moderate-intensity to high-intensity exercise. If you're just starting to exercise or didn't exercise much before your pregnancy, your provider may tell you to do low-intensity to moderate-intensity exercise. Questions to ask your health care provider Is  exercise safe for me? What are signs that I should stop exercising? Does my health condition mean that I should not exercise during pregnancy? When should I avoid exercising during pregnancy? Stop exercising and contact a health care provider if: You have any unusual symptoms, such as: Mild contractions or cramps in the belly. Dizziness that does not go away when you rest. Headache. Pain and swelling of your calves. Bleeding or fluid leaking from your vagina. Stop exercising and get help right away if: You have: Chest pain. Shortness of breath. Sudden, severe pain in your low back or your belly. Regular, painful contractions before 37 weeks of pregnancy. These symptoms may be an emergency. Call 911 right away. Do not wait to see if the symptoms will go away. Do not drive yourself to the hospital. This information is not intended to replace advice given to you by your health care provider. Make sure you discuss any questions you have with your health care provider. Document Revised: 05/04/2023 Document Reviewed: 05/04/2023 Elsevier Patient Education  2024 [employer].

## 2024-05-31 LAB — MICROSCOPIC EXAMINATION
Casts: NONE SEEN /LPF
Epithelial Cells (non renal): >10 /HPF — AB (ref 0–10)
RBC, Urine: NONE SEEN /HPF (ref 0–2)

## 2024-05-31 LAB — HCV INTERPRETATION

## 2024-05-31 LAB — URINALYSIS, ROUTINE W REFLEX MICROSCOPIC
Bilirubin, UA: NEGATIVE
Glucose, UA: NEGATIVE
Ketones, UA: NEGATIVE
Nitrite, UA: POSITIVE — AB
RBC, UA: NEGATIVE
Specific Gravity, UA: 1.023 (ref 1.005–1.030)
Urobilinogen, Ur: 0.2 mg/dL (ref 0.2–1.0)
pH, UA: 6 (ref 5.0–7.5)

## 2024-05-31 LAB — CBC/D/PLT+RPR+RH+ABO+RUBIGG...
Antibody Screen: NEGATIVE
Basophils Absolute: 0 x10E3/uL (ref 0.0–0.2)
Basos: 0 %
EOS (ABSOLUTE): 0.1 x10E3/uL (ref 0.0–0.4)
Eos: 1 %
HCV Ab: NONREACTIVE
HIV Screen 4th Generation wRfx: NONREACTIVE
Hematocrit: 38.6 % (ref 34.0–46.6)
Hemoglobin: 12.4 g/dL (ref 11.1–15.9)
Hepatitis B Surface Ag: NEGATIVE
Immature Grans (Abs): 0.1 x10E3/uL (ref 0.0–0.1)
Immature Granulocytes: 1 %
Lymphocytes Absolute: 3 x10E3/uL (ref 0.7–3.1)
Lymphs: 31 %
MCH: 30.7 pg (ref 26.6–33.0)
MCHC: 32.1 g/dL (ref 31.5–35.7)
MCV: 96 fL (ref 79–97)
Monocytes Absolute: 0.6 x10E3/uL (ref 0.1–0.9)
Monocytes: 6 %
Neutrophils Absolute: 6 x10E3/uL (ref 1.4–7.0)
Neutrophils: 61 %
Platelets: 164 x10E3/uL (ref 150–450)
RBC: 4.04 x10E6/uL (ref 3.77–5.28)
RDW: 12.5 % (ref 11.7–15.4)
RPR Ser Ql: NONREACTIVE
Rh Factor: POSITIVE
Rubella Antibodies, IGG: 1.57 {index} (ref >0.99)
Varicella zoster IgG: NONREACTIVE
WBC: 9.8 x10E3/uL (ref 3.4–10.8)

## 2024-05-31 LAB — CERVICOVAGINAL ANCILLARY ONLY
Chlamydia: NEGATIVE
Comment: NEGATIVE
Comment: NORMAL
Neisseria Gonorrhea: NEGATIVE

## 2024-06-03 LAB — MONITOR DRUG PROFILE 14(MW)
Amphetamine Scrn, Ur: NEGATIVE ng/mL
BARBITURATE SCREEN URINE: NEGATIVE ng/mL
BENZODIAZEPINE SCREEN, URINE: NEGATIVE ng/mL
Buprenorphine, Urine: NEGATIVE ng/mL
Cocaine (Metab) Scrn, Ur: NEGATIVE ng/mL
Creatinine(Crt), U: 178.1 mg/dL (ref 20.0–300.0)
Fentanyl, Urine: NEGATIVE pg/mL
Meperidine Screen, Urine: NEGATIVE ng/mL
Methadone Screen, Urine: NEGATIVE ng/mL
OXYCODONE+OXYMORPHONE UR QL SCN: NEGATIVE ng/mL
Opiate Scrn, Ur: NEGATIVE ng/mL
Ph of Urine: 5.9 (ref 4.5–8.9)
Phencyclidine Qn, Ur: NEGATIVE ng/mL
Propoxyphene Scrn, Ur: NEGATIVE ng/mL
SPECIFIC GRAVITY: 1.022
Tramadol Screen, Urine: NEGATIVE ng/mL

## 2024-06-03 LAB — CANNABINOID (GC/MS), URINE
Cannabinoid: POSITIVE — AB
Carboxy THC (GC/MS): 60 ng/mL

## 2024-06-03 LAB — URINE CULTURE, OB REFLEX

## 2024-06-03 LAB — CULTURE, OB URINE

## 2024-06-03 LAB — NICOTINE SCREEN, URINE: Cotinine Ql Scrn, Ur: POSITIVE ng/mL — AB

## 2024-06-04 LAB — MATERNIT 21 PLUS CORE, BLOOD
Fetal Fraction: 15
Result (T21): NEGATIVE
Trisomy 13 (Patau syndrome): NEGATIVE
Trisomy 18 (Edwards syndrome): NEGATIVE
Trisomy 21 (Down syndrome): NEGATIVE

## 2024-06-05 ENCOUNTER — Other Ambulatory Visit: Payer: Self-pay | Admitting: Advanced Practice Midwife

## 2024-06-05 ENCOUNTER — Ambulatory Visit: Payer: Self-pay | Admitting: Advanced Practice Midwife

## 2024-06-05 DIAGNOSIS — O2341 Unspecified infection of urinary tract in pregnancy, first trimester: Secondary | ICD-10-CM

## 2024-06-05 MED ORDER — AMOXICILLIN-POT CLAVULANATE 875-125 MG PO TABS: 1.0000 | ORAL_TABLET | Freq: Two times a day (BID) | ORAL | 0 refills | Status: AC

## 2024-06-05 NOTE — Progress Notes (Signed)
 Rx Augmentin  sent to treat Klebsiella Pneumoniae UTI. Message sent to patient.

## 2024-06-24 ENCOUNTER — Encounter: Payer: MEDICAID | Admitting: Obstetrics

## 2024-06-27 ENCOUNTER — Ambulatory Visit: Payer: MEDICAID | Admitting: Licensed Practical Nurse

## 2024-06-27 ENCOUNTER — Encounter: Payer: Self-pay | Admitting: Licensed Practical Nurse

## 2024-06-27 VITALS — BP 108/69 | HR 82 | Wt 156.0 lb

## 2024-06-27 DIAGNOSIS — Z3480 Encounter for supervision of other normal pregnancy, unspecified trimester: Secondary | ICD-10-CM

## 2024-06-27 DIAGNOSIS — Z3A16 16 weeks gestation of pregnancy: Secondary | ICD-10-CM | POA: Diagnosis not present

## 2024-06-27 DIAGNOSIS — Z3402 Encounter for supervision of normal first pregnancy, second trimester: Secondary | ICD-10-CM | POA: Diagnosis not present

## 2024-06-27 NOTE — Assessment & Plan Note (Addendum)
-  TWG 21 lbs -Prefers to get AFP next visit when her partner is with her -anatomy US  11/3 -anticipatory guidance on pregnancy discomforts reviewed  -Needs urine sent for [facility]  -warning signs reviewed.

## 2024-06-27 NOTE — Progress Notes (Addendum)
    Return Prenatal Note   Subjective   18 y.o. G1P0000 at [redacted]w[redacted]d presents for this follow-up prenatal visit.  Patient  Patient reports:Doing well, admits her mood can be rough, she can easily become impatient or annoyed, this happens about once a week. Does not plan  to travel in pregnancy.  Movement: Present Contractions: Not present  Objective   Flow sheet Vitals: Pulse Rate: 82 BP: 108/69 Fetal Heart Rate (bpm): 150 Total weight gain: 21 lb (9.526 kg)  General Appearance  No acute distress, well appearing, and well nourished Pulmonary   Normal work of breathing Neurologic   Alert and oriented to person, place, and time Psychiatric   Mood and affect within normal limits   Assessment/Plan   Plan  18 y.o. G1P0000 at [redacted]w[redacted]d presents for follow-up OB visit. Reviewed prenatal record including previous visit note.  Supervision of other normal pregnancy, antepartum -TWG 21 lbs -Prefers to get AFP next visit when her partner is with her -anatomy US  11/3 -anticipatory guidance on pregnancy discomforts reviewed  -Needs urine sent for [facility]  -warning signs reviewed.       Orders Placed This Encounter  Procedures   AFP, Serum, Open Spina Bifida    Is patient insulin dependent?:   No    Weight (lbs):   156    Gestational Age (GA), weeks:   16.2    Date on which patient was at this GA:   06/27/2024    GA Calculation Method:   LMP    Number of fetuses:   1    Donor egg?:   N    Release to patient:   Immediate [1]   Return in about 4 weeks (around 07/25/2024) for ROB.   Future Appointments  Date Time Provider Department Center  07/25/2024  1:00 PM [facility] US  1 [facility] None  07/27/2024  1:55 PM Leigh Sober, MD [facility] None    For next visit:  continue with routine prenatal care     LYDIA [facility], CNM  10/06/254:58 PM

## 2024-07-25 ENCOUNTER — Ambulatory Visit: Payer: MEDICAID

## 2024-07-25 DIAGNOSIS — Z3A20 20 weeks gestation of pregnancy: Secondary | ICD-10-CM

## 2024-07-25 DIAGNOSIS — Z3689 Encounter for other specified antenatal screening: Secondary | ICD-10-CM

## 2024-07-27 ENCOUNTER — Encounter: Payer: MEDICAID | Admitting: Obstetrics

## 2024-07-27 DIAGNOSIS — Z87440 Personal history of urinary (tract) infections: Secondary | ICD-10-CM

## 2024-07-27 DIAGNOSIS — Z3480 Encounter for supervision of other normal pregnancy, unspecified trimester: Secondary | ICD-10-CM

## 2024-07-27 DIAGNOSIS — Z3A20 20 weeks gestation of pregnancy: Secondary | ICD-10-CM

## 2024-07-27 NOTE — Progress Notes (Deleted)
    Return Prenatal Note   Subjective  18 y.o. G1P0000 at [redacted]w[redacted]d presents for this follow-up prenatal visit.  Patient ***  Patient reports:   Denies vaginal bleeding or leaking fluid. Objective  Flow sheet Vitals:   Total weight gain: 21 lb (9.526 kg)  General Appearance  No acute distress, well appearing, and well nourished Pulmonary   Normal work of breathing Neurologic   Alert and oriented to person, place, and time Psychiatric   Mood and affect within normal limits   Assessment/Plan   Plan  18 y.o. G1P0000 at [redacted]w[redacted]d presents for follow-up OB visit. Reviewed prenatal record including previous visit note. 1. Supervision of other normal pregnancy, antepartum (Primary)  2. [redacted] weeks gestation of pregnancy  3. History of UTI   No problem-specific Assessment & Plan notes found for this encounter.    No orders of the defined types were placed in this encounter.  No follow-ups on file.   No future appointments.  For next visit:  {SJFprenatalcare:29716}      Estil Mangle, DO  [facility]

## 2024-07-28 ENCOUNTER — Encounter: Payer: MEDICAID | Admitting: Advanced Practice Midwife

## 2024-07-28 NOTE — Progress Notes (Deleted)
    Routine Prenatal Care Visit  Subjective  Courtney [name] is a 18 y.o. G1P0000 at [redacted]w[redacted]d being seen today for ongoing prenatal care.  She is currently monitored for the following issues for this low-risk pregnancy and has Nicotine  vapor product user and Supervision of other normal pregnancy, antepartum on their problem list.  ----------------------------------------------------------------------------------- Patient reports {sx:14538}.    .  .   SABRA Limbo Fluid {Actions; denies/reports/admits to:19208}.  ----------------------------------------------------------------------------------- The following portions of the patient's history were reviewed and updated as appropriate: allergies, current medications, past family history, past medical history, past social history, past surgical history and problem list. Problem list updated.  Objective  LMP 03/05/2024 (Exact Date)  Pregravid weight 135 lb (61.2 kg) Total Weight Gain 21 lb (9.526 kg) Urinalysis: Urine Protein    Urine Glucose    Fetal Status:            General:  Alert, oriented and cooperative. Patient is in no acute distress.  Skin: Skin is warm and dry. No rash noted.   Cardiovascular: Normal heart rate noted  Respiratory: Normal respiratory effort, no problems with respiration noted  Abdomen: Soft, gravid, appropriate for gestational age.       Pelvic:  {Blank single:19197::Cervical exam performed,Cervical exam deferred}        Extremities: Normal range of motion.     Mental Status: Normal mood and affect. Normal behavior. Normal judgment and thought content.   Assessment   18 y.o. G1P0000 at [redacted]w[redacted]d by  12/10/2024, by Last Menstrual Period presenting for routine prenatal visit  Plan   FIRST Problems (from 04/25/24 to present)     No problems associated with this episode.        {Blank single:19197::Term,Preterm} labor symptoms and general obstetric precautions including but not limited to vaginal  bleeding, contractions, leaking of fluid and fetal movement were reviewed in detail with the patient. Please refer to After Visit Summary for other counseling recommendations.   No follow-ups on file.    Slater Rains, CNM [facility] 07/28/2024 1:45 PM

## 2024-08-03 NOTE — Progress Notes (Signed)
    Return Prenatal Note   Assessment/Plan   Plan  18 y.o. G1P0000 at [redacted]w[redacted]d presents for follow-up OB visit. Reviewed prenatal record including previous visit note.  Supervision of other normal pregnancy, antepartum -Reviewed normal anatomy scan -Encouraged to watch childbirth videos -Discussed weight gain of 35 lbs at 22 weeks. Encouraged increased protein, decreasing carbs/sugar in diet as well as regular exercise. [place] declines nutrition consult -Anticipatory guidance about late second trimester and fetal development -Discussed labs at next visit. Instructions given on preparation for glucose test. -Reviewed kick counts and preterm labor warning signs. Instructed to call office or come to hospital with persistent headache, vision changes, regular contractions, leaking of fluid, decreased fetal movement or vaginal bleeding.      Orders Placed This Encounter  Procedures   28 Week RH+Panel    Standing Status:   Future    Expiration Date:   08/08/2025   Return in about 4 weeks (around 09/05/2024).   No future appointments.   For next visit:  ROB with 28-week labs and TDaP    Subjective   Courtney [name] is feeling well. Baby is active. She is interested in CBE but is busy working full time and going to school. She is trying to make healthy choices but feels the baby wants me to eat more than she wants me to be active!   Movement: Present Contractions: Not present  Objective   Flow sheet Vitals: Pulse Rate: 89 BP: 122/71 Total weight gain: 35 lb (15.9 kg)  General Appearance  No acute distress, well appearing, and well nourished Pulmonary   Normal work of breathing Neurologic   Alert and oriented to person, place, and time Psychiatric   Mood and affect within normal limits  Eleanor Canny, CNM 08/08/24 3:47 PM

## 2024-08-08 ENCOUNTER — Encounter: Payer: Self-pay | Admitting: Obstetrics

## 2024-08-08 ENCOUNTER — Ambulatory Visit (INDEPENDENT_AMBULATORY_CARE_PROVIDER_SITE_OTHER): Payer: MEDICAID | Admitting: Obstetrics

## 2024-08-08 VITALS — BP 122/71 | HR 89 | Wt 170.0 lb

## 2024-08-08 DIAGNOSIS — Z3A22 22 weeks gestation of pregnancy: Secondary | ICD-10-CM | POA: Diagnosis not present

## 2024-08-08 DIAGNOSIS — Z3480 Encounter for supervision of other normal pregnancy, unspecified trimester: Secondary | ICD-10-CM

## 2024-08-08 DIAGNOSIS — Z3482 Encounter for supervision of other normal pregnancy, second trimester: Secondary | ICD-10-CM | POA: Diagnosis not present

## 2024-08-08 DIAGNOSIS — Z720 Tobacco use: Secondary | ICD-10-CM

## 2024-08-08 DIAGNOSIS — Z131 Encounter for screening for diabetes mellitus: Secondary | ICD-10-CM

## 2024-08-08 DIAGNOSIS — D649 Anemia, unspecified: Secondary | ICD-10-CM

## 2024-08-08 DIAGNOSIS — Z113 Encounter for screening for infections with a predominantly sexual mode of transmission: Secondary | ICD-10-CM

## 2024-08-08 NOTE — Assessment & Plan Note (Addendum)
-  Reviewed normal anatomy scan -Encouraged to watch childbirth videos -Discussed weight gain of 35 lbs at 22 weeks. Encouraged increased protein, decreasing carbs/sugar in diet as well as regular exercise. Raizy declines nutrition consult -Anticipatory guidance about late second trimester and fetal development -Discussed labs at next visit. Instructions given on preparation for glucose test. -Reviewed kick counts and preterm labor warning signs. Instructed to call office or come to hospital with persistent headache, vision changes, regular contractions, leaking of fluid, decreased fetal movement or vaginal bleeding.

## 2024-08-08 NOTE — Addendum Note (Signed)
 Addended by: TAMEA ANNALEE DEL on: 08/08/2024 03:54 PM   Modules accepted: Orders

## 2024-08-11 ENCOUNTER — Ambulatory Visit: Payer: Self-pay | Admitting: Obstetrics

## 2024-08-11 ENCOUNTER — Other Ambulatory Visit: Payer: Self-pay | Admitting: Obstetrics

## 2024-08-11 LAB — URINE CULTURE

## 2024-08-11 MED ORDER — CEPHALEXIN 500 MG PO CAPS: 500.0000 mg | ORAL_CAPSULE | Freq: Four times a day (QID) | ORAL | 0 refills | Status: AC

## 2024-08-11 NOTE — Progress Notes (Signed)
 UC shows >100,000 cfu of klebsiella. Rx sent for Keflex 500 PO QID.  Courtney [name] M Courtney [name], CNM

## 2024-09-05 ENCOUNTER — Ambulatory Visit: Payer: MEDICAID | Admitting: Advanced Practice Midwife

## 2024-09-05 ENCOUNTER — Other Ambulatory Visit: Payer: MEDICAID

## 2024-09-05 ENCOUNTER — Encounter: Payer: Self-pay | Admitting: Advanced Practice Midwife

## 2024-09-05 VITALS — BP 108/69 | HR 94 | Wt 179.7 lb

## 2024-09-05 DIAGNOSIS — Z3480 Encounter for supervision of other normal pregnancy, unspecified trimester: Secondary | ICD-10-CM

## 2024-09-05 DIAGNOSIS — Z3402 Encounter for supervision of normal first pregnancy, second trimester: Secondary | ICD-10-CM

## 2024-09-05 DIAGNOSIS — Z113 Encounter for screening for infections with a predominantly sexual mode of transmission: Secondary | ICD-10-CM

## 2024-09-05 DIAGNOSIS — Z3A26 26 weeks gestation of pregnancy: Secondary | ICD-10-CM

## 2024-09-05 DIAGNOSIS — D649 Anemia, unspecified: Secondary | ICD-10-CM

## 2024-09-05 DIAGNOSIS — Z131 Encounter for screening for diabetes mellitus: Secondary | ICD-10-CM

## 2024-09-05 DIAGNOSIS — Z720 Tobacco use: Secondary | ICD-10-CM

## 2024-09-05 NOTE — Progress Notes (Signed)
° ° °  Routine Prenatal Care Visit  Subjective  Courtney [name] is a 18 y.o. G1P0000 at [redacted]w[redacted]d being seen today for ongoing prenatal care.  She is currently monitored for the following issues for this low-risk pregnancy and has Nicotine  vapor product user and Supervision of normal pregnancy on their problem list.  ----------------------------------------------------------------------------------- Patient reports she is doing well. Has not signed up for [facility]. She is reading/watching videos.   Contractions: Not present. Vag. Bleeding: None.  Movement: Present. Leaking Fluid denies.  ----------------------------------------------------------------------------------- The following portions of the patient's history were reviewed and updated as appropriate: allergies, current medications, past family history, past medical history, past social history, past surgical history and problem list. Problem list updated.  Objective  BP 108/69   Pulse 94   Wt 179 lb 11.2 oz (81.5 kg)   LMP 03/05/2024 (Exact Date)   BMI 31.83 kg/m  Pregravid weight 135 lb (61.2 kg) Total Weight Gain 44 lb 11.2 oz (20.3 kg) Urinalysis: Urine Protein    Urine Glucose    Fetal Status: Fetal Heart Rate (bpm): 147 Fundal Height: 26 cm Movement: Present      General:  Alert, oriented and cooperative. Patient is in no acute distress.  Skin: Skin is warm and dry. No rash noted.   Cardiovascular: Normal heart rate noted  Respiratory: Normal respiratory effort, no problems with respiration noted  Abdomen: Soft, gravid, appropriate for gestational age. Pain/Pressure: Absent     Pelvic:  Cervical exam deferred        Extremities: Normal range of motion.  Edema: None  Mental Status: Normal mood and affect. Normal behavior. Normal judgment and thought content.   Assessment   18 y.o. G1P0000 at [redacted]w[redacted]d by  12/10/2024, by Last Menstrual Period presenting for routine prenatal visit  Plan   FIRST Problems (from 04/25/24 to present)     Clinical Staff Provider  Office Location  [facility] Dating  LMP  Language  English Anatomy US   07/25/24 WNL; Posterior placenta  Flu Vaccine  Declined Genetic Screen  NIPS: WNL; female  TDaP vaccine  Declined Hgb A1C or  GTT Early : Not Done Third trimester :   Covid Declined   LAB RESULTS   Rhogam  A/Positive/-- (09/08 1043)  Blood Type A/Positive/-- (09/08 1043)   RSV Offer Antibody Negative (09/08 1043)  Feeding Plan Breastfeed Rubella 1.57 (09/08 1043)  Contraception BC Pills RPR Non Reactive (09/08 1043)   Circumcision Yes HBsAg Negative (09/08 1043)   Pediatrician  [facility] HIV Non Reactive (09/08 1043)  Support Person FOB & Best Friend Varicella Non Reactive (09/08 1043)  Prenatal Classes No GBS  (For PCN allergy, check sensitivities)     Hep C Non Reactive (09/08 1043)   BTL Consent NA Pap No results found for: DIAGPAP  VBAC Consent NA Hgb Electro      CF      SMA           Preterm labor symptoms and general obstetric precautions including but not limited to vaginal bleeding, contractions, leaking of fluid and fetal movement were reviewed in detail with the patient. Please refer to After Visit Summary for other counseling recommendations.   Return in about 2 weeks (around 09/19/2024) for rob.  Slater Rains, CNM 09/05/2024 3:39 PM

## 2024-09-05 NOTE — Patient Instructions (Signed)
 Oral Glucose Tolerance Test During Pregnancy Why am I having this test? The oral glucose tolerance test (OGTT) is done to check how your body uses blood sugar, also called glucose. It's one of many tests used to diagnose the type of diabetes you can get while pregnant. This type of diabetes is called gestational diabetes mellitus (GDM). You may get GDM during the middle part of your pregnancy. In most cases, it goes away after you give birth. Most people get tested for GDM around weeks 24-28 of pregnancy. You may have the test sooner if: You or your mother had diabetes while pregnant. A person in your family has diabetes. You're having more than one baby this pregnancy. You've had a baby before who weighed more than 9 pounds (4 kg) at birth. You have high blood pressure or heart disease. You have a large body. You're not active. What is being tested? This test measures your blood sugar at different times. It shows how well your body uses the sugar in your blood. What kind of sample is taken?  A sample of blood is needed for this test. The sample is taken by putting a needle into a blood vessel. How do I prepare for this test? Eat your normal meals the day before the test. Your health care provider will tell you about: Eating or drinking on the day of the test. You may need to fast for 8-10 hours before the test. When you fast, you can only have water. Changing or stopping your regular medicines. Some medicines may affect your test results. Tell a health care provider about: All medicines you take. These include vitamins, herbs, eye drops, and creams. What happens during the test? The test involves these steps: Your blood sugar will be checked. It's called your fasting blood sugar if you fasted before the test. You'll drink a sugary mixture. Your blood sugar will be checked again. For a 1-hour test, it will be checked after an hour. For a 3-hour test, it will be checked 1, 2, and 3 hours  after you drink the sugary mixture. The test takes 1-3 hours. You'll need to stay at the testing place during this time. During the testing time: Do not eat or drink anything after the sugary drink. Do not exercise. Do not use any products that contain nicotine  or tobacco. These products include cigarettes, chewing tobacco, and vaping devices, such as e-cigarettes. The test may vary among providers and hospitals. How are the results reported? Your provider will compare your results to normal values for the kind of test that you had done. You may need to call or meet with your provider to get your results. What do the results mean? Your provider can tell you what blood sugar levels are normal for the test you're doing. If two or more of your blood sugar levels are at or above normal, you may be diagnosed with GDM. If only one level is high, your provider may suggest: Doing the test again. Doing other tests to confirm a diagnosis. Talk with your provider about what your results mean. Questions to ask your health care provider Ask your provider, or the department doing the test: When will my results be ready? How will I get my results? What are my next steps? This information is not intended to replace advice given to you by your health care provider. Make sure you discuss any questions you have with your health care provider. Document Revised: 01/12/2023 Document Reviewed: 01/12/2023 Elsevier Patient Education  2024 [employer]Second Trimester of Pregnancy  The second trimester of pregnancy is from week 14 through week 27. This is months 4 through 6 of pregnancy. During the second trimester: Morning sickness is less or has stopped. You may have more energy. You may feel hungry more often. At this time, your unborn baby is growing very fast. At the end of the sixth month, the unborn baby may be up to 12 inches long and weigh about 1 pounds. You will likely start to feel the baby move  between 16 and 20 weeks of pregnancy. Body changes during your second trimester Your body continues to change during this time. The changes usually go away after your baby is born. Physical changes You will gain more weight. Your belly will get bigger. You may begin to get stretch marks on your hips, belly, and breasts. Your breasts will keep growing and may hurt. You may get dark spots or blotches on your face. A dark line from your belly button to the pubic area may appear. This line is called linea nigra. Your hair may grow faster and get thicker. Health changes You may have headaches. You may have heartburn. You may pee more often. You may have swollen, bulging veins (varicose veins). You may have trouble pooping (constipation), or swollen veins in the butt that can itch or get painful (hemorrhoids). You may have back pain. This is caused by: Weight gain. Pregnancy hormones that are relaxing the joints in your pelvis. Follow these instructions at home: Medicines Talk to your health care provider if you're taking medicines. Ask if the medicines are safe to take during pregnancy. Your provider may change the medicines that you take. Do not take any medicines unless told to by your provider. Take a prenatal vitamin that has at least 600 micrograms (mcg) of folic acid. Do not use herbal medicines, illegal drugs, or medicines that are not approved by your provider. Eating and drinking While you're pregnant your body needs extra food for your growing baby. Talk with your provider about what to eat while pregnant. Activity Most women are able to exercise during pregnancy. Exercises may need to change as your pregnancy goes on. Talk to your provider about your activities and exercise routines. Relieving pain and discomfort Wear a good, supportive bra if your breasts hurt. Rest with your legs raised if you have leg cramps or low back pain. Take warm sitz baths to soothe pain from  hemorrhoids. Use hemorrhoid cream if your provider says it's okay. Do not douche. Do not use tampons or scented pads. Do not use hot tubs, steam rooms, or saunas. Safety Wear your seatbelt at all times when you're in a car. Talk to your provider if someone hits you, hurts you, or yells at you. Talk with your provider if you're feeling sad or have thoughts of hurting yourself. Lifestyle Certain things can be harmful while you're pregnant. It's best to avoid the following: Do not drink alcohol,smoke, vape, or use products with nicotine  or tobacco in them. If you need help quitting, talk with your provider. Avoid cat litter boxes and soil used by cats. These things carry germs that can cause harm to your pregnancy and your baby. General instructions Keep all follow-up visits. It helps you and your unborn baby stay as healthy as possible. Write down your questions. Take them to your prenatal visits. Your provider will: Talk with you about your overall health. Give you advice or refer you to specialists who can help  with different needs, including: Prenatal education classes. Mental health and counseling. Foods and healthy eating. Ask for help if you need help with food. Where to find more information American Pregnancy Association: americanpregnancy.org [employer]: acog.org Office on [employer] Health: [employer] Contact a health care provider if: You have a headache that does not go away when you take medicine. You have any of these problems: You can't eat or drink. You throw up or feel like you may throw up. You have watery poop (diarrhea) for 2 days or more. You have pain when you pee or your pee smells bad. You have been sick for 2 days or more and are not getting better. Contact your provider right away if: You have any of these coming from your vagina: Abnormal discharge. Bad-smelling fluid. Bleeding. Your baby is moving less than  usual. You have contractions, belly cramping, or have pain in your pelvis or lower back. You have symptoms of high blood pressure or preeclampsia. These include: A severe, throbbing headache that does not go away. Sudden or extreme swelling of your face, hands, legs, or feet. Vision problems: You see spots. You have blurry vision. Your eyes are sensitive to light. If you can't reach the provider, go to an urgent care or emergency room. Get help right away if: You faint, become confused, or can't think clearly. You have chest pain or trouble breathing. You have any kind of injury, such as from a fall or a car crash. These symptoms may be an emergency. Call 911 right away. Do not wait to see if the symptoms will go away. Do not drive yourself to the hospital. This information is not intended to replace advice given to you by your health care provider. Make sure you discuss any questions you have with your health care provider. Document Revised: 06/11/2023 Document Reviewed: 01/09/2023 Elsevier Patient Education  2024 [employer].

## 2024-09-06 LAB — 28 WEEK RH+PANEL
Basophils Absolute: 0.1 x10E3/uL (ref 0.0–0.2)
Basos: 0 %
EOS (ABSOLUTE): 0.1 x10E3/uL (ref 0.0–0.4)
Eos: 1 %
Gestational Diabetes Screen: 91 mg/dL (ref 70–139)
HIV Screen 4th Generation wRfx: NONREACTIVE
Hematocrit: 34.7 % (ref 34.0–46.6)
Hemoglobin: 11.6 g/dL (ref 11.1–15.9)
Immature Grans (Abs): 0.2 x10E3/uL — ABNORMAL HIGH (ref 0.0–0.1)
Immature Granulocytes: 2 %
Lymphocytes Absolute: 3 x10E3/uL (ref 0.7–3.1)
Lymphs: 24 %
MCH: 30.4 pg (ref 26.6–33.0)
MCHC: 33.4 g/dL (ref 31.5–35.7)
MCV: 91 fL (ref 79–97)
Monocytes Absolute: 0.7 x10E3/uL (ref 0.1–0.9)
Monocytes: 6 %
Neutrophils Absolute: 8.5 x10E3/uL — ABNORMAL HIGH (ref 1.4–7.0)
Neutrophils: 67 %
Platelets: 162 x10E3/uL (ref 150–450)
RBC: 3.81 x10E6/uL (ref 3.77–5.28)
RDW: 11.8 % (ref 11.7–15.4)
RPR Ser Ql: NONREACTIVE
WBC: 12.5 x10E3/uL — ABNORMAL HIGH (ref 3.4–10.8)

## 2024-09-10 NOTE — Procedures (Signed)
 NST Interpretation Indication: r/o PTL Baseline: 140 bpm Variability: moderate Accelerations: present Decelerations: absent Contractions: none Time noted:  See OBIX Impression: reactive Authenticated by: Almarie LITTIE Motley, MD   Almarie LITTIE Motley, MD 09/10/2024  10:52 PM

## 2024-09-10 NOTE — Nursing Note (Signed)
 Patient reports to triage today in the setting of cramping like pain in her lower abdomen. Patient states cramping has been constant since 1330. Patient observed to have mild abd tenderness on left side upon palpation. Patient denies ha, ruq pain and or vision changes at this time. Vss. Efm applied. Denies loss of fluid, vaginal bleeding or abnormal vaginal discharge at this time. Reports + fm. MD notified. Care ongoing at this time.

## 2024-09-11 NOTE — Nursing Note (Signed)
 Pt discharged home, pt ambulated independently out of unit. Pt instructed to return with leaking fluid from vagina; regular, painful contractions; heavy vaginal bleeding; decreased fetal movement; headache not relieved by rest, hydration, or tylenol ; vision changes or upper right sided abdominal pain. Pt verbalized understanding. All questions answered.

## 2024-09-12 ENCOUNTER — Other Ambulatory Visit: Payer: Self-pay

## 2024-09-12 NOTE — Progress Notes (Signed)
 Patient called to make sure it was safe to take an antibiotic prescribed to her over the weekend for a kidney infection.  She says she has been given Macrobid by [facility] to take daily until she delivers.  Advised this is safe to take in pregnancy but asked that she let her Midwife know at her next appointment that she is now taking this medication.

## 2024-09-19 ENCOUNTER — Encounter: Payer: MEDICAID | Admitting: Registered Nurse

## 2024-09-27 ENCOUNTER — Ambulatory Visit: Payer: MEDICAID | Admitting: Certified Nurse Midwife

## 2024-09-27 VITALS — BP 114/78 | HR 97 | Wt 184.8 lb

## 2024-09-27 DIAGNOSIS — Z3A29 29 weeks gestation of pregnancy: Secondary | ICD-10-CM

## 2024-09-27 DIAGNOSIS — R300 Dysuria: Secondary | ICD-10-CM

## 2024-09-27 DIAGNOSIS — O2343 Unspecified infection of urinary tract in pregnancy, third trimester: Secondary | ICD-10-CM

## 2024-09-27 LAB — POCT URINALYSIS DIPSTICK OB
Bilirubin, UA: NEGATIVE
Glucose, UA: NEGATIVE
Ketones, UA: NEGATIVE
Leukocytes, UA: NEGATIVE
Nitrite, UA: NEGATIVE
Spec Grav, UA: 1.015
Urobilinogen, UA: 0.2 U/dL
pH, UA: 6.5

## 2024-09-30 LAB — URINE CULTURE

## 2024-10-03 ENCOUNTER — Other Ambulatory Visit: Payer: Self-pay | Admitting: Certified Nurse Midwife

## 2024-10-03 ENCOUNTER — Ambulatory Visit: Payer: Self-pay | Admitting: Certified Nurse Midwife

## 2024-10-03 MED ORDER — NITROFURANTOIN MONOHYD MACRO 100 MG PO CAPS: 100.0000 mg | ORAL_CAPSULE | Freq: Every day | ORAL | 0 refills | Status: AC

## 2024-10-03 MED ORDER — NITROFURANTOIN MONOHYD MACRO 100 MG PO CAPS: 100.0000 mg | ORAL_CAPSULE | Freq: Two times a day (BID) | ORAL | 1 refills | Status: AC

## 2024-10-06 DIAGNOSIS — O2340 Unspecified infection of urinary tract in pregnancy, unspecified trimester: Secondary | ICD-10-CM | POA: Insufficient documentation

## 2024-10-06 NOTE — Progress Notes (Signed)
" ° ° °  Return Prenatal Note   Assessment/Plan   Plan  19 y.o. G1P0000 at [redacted]w[redacted]d presents for follow-up OB visit. Reviewed prenatal record including previous visit note.  Recurrent UTI (urinary tract infection) complicating pregnancy 9/8 - Klebsiella - Augmentin  ordered 9/30- Strep Mitis - Keflex  ordered 11/20 - Klebsiella - Keflex  ordered 11/23- Seen at [facility] - Macrobid  suppression recommended 1/6 - Staphylococcus epidermidis (contaminant?) but patient is still symptomatic. Will use Macrobid  for suppression.     Orders Placed This Encounter  Procedures   Urine Culture   POC Urinalysis Dipstick OB   No follow-ups on file.   Future Appointments  Date Time Provider Department Center  10/11/2024  2:35 PM Dominic, Jinnie Jansky, CNM [facility] None    For next visit:  continue with routine prenatal care     Subjective   18 y.o. G1P0000 at [redacted]w[redacted]d presents for this follow-up prenatal visit.  Patient reports pain with urination. Patient reports: Movement: Present Contractions: Not present  Objective   Flow sheet Vitals: Pulse Rate: 97 BP: 114/78 Total weight gain: 49 lb 12.8 oz (22.6 kg)  General Appearance  No acute distress, well appearing, and well nourished Pulmonary   Normal work of breathing Neurologic   Alert and oriented to person, place, and time Psychiatric   Mood and affect within normal limits  Damien PARSLEY, CNM  01/15/264:53 PM "

## 2024-10-06 NOTE — Assessment & Plan Note (Signed)
 9/8 - Klebsiella - Augmentin  ordered 9/30- Strep Mitis - Keflex  ordered 11/20 - Klebsiella - Keflex  ordered 11/23- Seen at [facility] - Macrobid  suppression recommended 1/6 - Staphylococcus epidermidis (contaminant?) but patient is still symptomatic. Will use Macrobid  for suppression.

## 2024-10-11 ENCOUNTER — Encounter: Payer: MEDICAID | Admitting: Licensed Practical Nurse

## 2024-10-17 ENCOUNTER — Encounter: Payer: MEDICAID | Admitting: Certified Nurse Midwife

## 2024-10-20 NOTE — Progress Notes (Unsigned)
" ° ° °  Return Prenatal Note   Subjective   19 y.o. G1P0000 at [redacted]w[redacted]d presents for this follow-up prenatal visit.  Patient *** Patient reports:    Objective   Flow sheet Vitals:   Total weight gain: 49 lb 12.8 oz (22.6 kg)  General Appearance  No acute distress, well appearing, and well nourished Pulmonary   Normal work of breathing Neurologic   Alert and oriented to person, place, and time Psychiatric   Mood and affect within normal limits   Assessment/Plan   Plan  19 y.o. G1P0000 at [redacted]w[redacted]d presents for follow-up OB visit. Reviewed prenatal record including previous visit note.  No problem-specific Assessment & Plan notes found for this encounter.      No orders of the defined types were placed in this encounter.  No follow-ups on file.   Future Appointments  Date Time Provider Department Center  10/21/2024  9:55 AM Dominic, Jinnie Jansky, CNM [facility] None    For next visit:  {[id number]}     Burnard LITTIE Ro, CMA  01/29/262:28 PM  "

## 2024-10-21 ENCOUNTER — Encounter: Payer: MEDICAID | Admitting: Licensed Practical Nurse

## 2024-10-21 DIAGNOSIS — Z3403 Encounter for supervision of normal first pregnancy, third trimester: Secondary | ICD-10-CM

## 2024-10-21 DIAGNOSIS — Z3A32 32 weeks gestation of pregnancy: Secondary | ICD-10-CM
